# Patient Record
Sex: Female | Born: 1985 | Race: Black or African American | Hispanic: No | Marital: Single | Smoking: Never smoker
Health system: Southern US, Community
[De-identification: ages and names within clinical notes are randomized; demographics above are authoritative.]

## PROBLEM LIST (undated history)

## (undated) DIAGNOSIS — F32A Depression, unspecified: Secondary | ICD-10-CM

## (undated) DIAGNOSIS — F419 Anxiety disorder, unspecified: Secondary | ICD-10-CM

## (undated) DIAGNOSIS — I10 Essential (primary) hypertension: Secondary | ICD-10-CM

---
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
## 2008-02-20 ENCOUNTER — Emergency Department (HOSPITAL_COMMUNITY): Admission: EM | Admit: 2008-02-20 | Discharge: 2008-02-20 | Payer: Self-pay | Admitting: Emergency Medicine

## 2008-06-01 ENCOUNTER — Emergency Department (HOSPITAL_COMMUNITY): Admission: EM | Admit: 2008-06-01 | Discharge: 2008-06-01 | Payer: Self-pay | Admitting: Emergency Medicine

## 2010-09-30 NOTE — Discharge Summary (Signed)
NAMECORTNI, [name] NO.:  [id number]   MEDICAL RECORD NO.:  [id number]          PATIENT TYPE:  IPS   LOCATION:  0404                          FACILITY:  [facility]   PHYSICIAN:  Jasmine Pang, M.D. DATE OF BIRTH:  28-Dec-1985   DATE OF ADMISSION:  06/27/2006  DATE OF DISCHARGE:  06/29/2006                               DISCHARGE SUMMARY   PATIENT IDENTIFICATION:  A 25 year old, single, African-American female  who was admitted on a voluntary basis on June 27, 2006.   HISTORY OF PRESENT ILLNESS:  The patient got into an argument with a  friend who had picked on her about no job and no money. 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 The patient was scolded by her  mother. She subsequently swallowed a number of pills (Ambien). She had a  prescription for this at [facility]. This is the  first inpatient psychiatric admission for patient. She has had no other  attempts, no prior care, no history of learning disabilities. The  patient dropped out of [employer] after one semester with adjustment issues.  The patient is a nonsmoker. She does not use drugs or alcohol. She has  no medical problems, no medications. She gets a rash to PENICILLIN.   PHYSICAL FINDINGS:  The patient's physical examination was done in the  ED prior to admission. There were no acute medical problems. She had  been stabilized after her overdose.   ADMISSION LABORATORIES:  Chemistry profile was normal, liver profile  normal, CBC normal, TSH was 1.021 which was within normal limits.   HOSPITAL COURSE:  Upon admission, the patient was not started on any  medications at this time. She did not want to be on medication. Upon  first meeting her, the patient stated she had only taken 4-5 pills of  the Ambien. She was in therapy but cannot remember her therapist's name.  The patient was lying in bed when I met with her. She was friendly and  cooperative. She states she was sad  but wants to go home. She states  she only went to therapy one time. On June 29, 2006, the patient's  mental status had improved markedly. She was friendly, cooperative,  conversant, she had good eye contact, speech was normal rate and flow.  Psychomotor activity was within normal limits. The mood was euthymic,  affect wide range. No suicidal or homicidal ideation, no thoughts about  self-injurious behavior. No auditory or visual hallucinations, no  paranoia or delusions. Thoughts were logical and goal directed. Thought  content no predominant theme. Cognitive was grossly back to baseline  which was within normal limits.   DISCHARGE DIAGNOSES:  AXIS I:  Adjustment disorder not otherwise  specified. (Rule out underlying mood disorder.)  Axis II.  None.  AXIS III:  Status post overdose on Ambien, medically stable.  AXIS IV:  Severe (stage of life and adjustment issues, problems with  primary support group, occupational problem, education problem).  AXIS V:  GAF upon discharge was 50, GAF upon admission 35-40, GAF  highest past year  was 70.   POST HOSPITAL CARE PLANS:  There were no specific activity level or  dietary restrictions. The patient was scheduled for shortterm therapy  with a therapist at the [facility]. This was being arranged by her  case Production designer, theatre/television/film.   DISCHARGE MEDICATIONS:  None.      Jasmine Pang, M.D.  Electronically Signed     BHS/MEDQ  D:  08/17/2006  T:  08/17/2006  Job:  [id number]

## 2015-09-07 ENCOUNTER — Emergency Department: Payer: Enrolled Prime—HMO

## 2015-09-07 ENCOUNTER — Emergency Department
Admission: EM | Admit: 2015-09-07 | Discharge: 2015-09-07 | Disposition: A | Discharge status: Home / Self Care | Payer: Enrolled Prime—HMO | Attending: Emergency Medicine | Admitting: Emergency Medicine

## 2015-09-07 DIAGNOSIS — S51812A Laceration without foreign body of left forearm, initial encounter: Secondary | ICD-10-CM | POA: Insufficient documentation

## 2015-09-07 DIAGNOSIS — X781XXA Intentional self-harm by knife, initial encounter: Secondary | ICD-10-CM | POA: Insufficient documentation

## 2015-09-07 DIAGNOSIS — S41112A Laceration without foreign body of left upper arm, initial encounter: Secondary | ICD-10-CM

## 2015-09-07 LAB — COMPREHENSIVE METABOLIC PANEL
ALT: 10 U/L (ref 0–55)
AST (SGOT): 15 U/L (ref 5–34)
Albumin/Globulin Ratio: 1.3 (ref 0.9–2.2)
Albumin: 4.1 g/dL (ref 3.5–5.0)
Alkaline Phosphatase: 40 U/L (ref 37–106)
BUN: 11 mg/dL (ref 7.0–19.0)
Bilirubin, Total: 0.3 mg/dL (ref 0.2–1.2)
CO2: 18 mEq/L — ABNORMAL LOW (ref 22–29)
Calcium: 9.2 mg/dL (ref 8.5–10.5)
Chloride: 109 mEq/L (ref 100–111)
Creatinine: 0.9 mg/dL (ref 0.6–1.0)
Globulin: 3.1 g/dL (ref 2.0–3.6)
Glucose: 98 mg/dL (ref 70–100)
Potassium: 4.1 mEq/L (ref 3.5–5.1)
Protein, Total: 7.2 g/dL (ref 6.0–8.3)
Sodium: 139 mEq/L (ref 136–145)

## 2015-09-07 LAB — GFR: EGFR: >60

## 2015-09-07 LAB — ECG 12-LEAD
Atrial Rate: 105 {beats}/min
P Axis: 43 degrees
P-R Interval: 176 ms
Q-T Interval: 344 ms
QRS Duration: 70 ms
QTC Calculation (Bezet): 454 ms
R Axis: 16 degrees
T Axis: 12 degrees
Ventricular Rate: 105 {beats}/min

## 2015-09-07 LAB — RAPID DRUG SCREEN, URINE
Barbiturate Screen, UR: NEGATIVE
Benzodiazepine Screen, UR: NEGATIVE
Cannabinoid Screen, UR: NEGATIVE
Cocaine, UR: NEGATIVE
Opiate Screen, UR: NEGATIVE
PCP Screen, UR: NEGATIVE
Urine Amphetamine Screen: NEGATIVE

## 2015-09-07 LAB — URINALYSIS, REFLEX TO MICROSCOPIC EXAM IF INDICATED
Bilirubin, UA: NEGATIVE
Blood, UA: NEGATIVE
Glucose, UA: NEGATIVE
Ketones UA: NEGATIVE
Nitrite, UA: NEGATIVE
Protein, UR: 100 — AB
Specific Gravity UA: 1.009 (ref 1.001–1.035)
Urine pH: 7 (ref 5.0–8.0)
Urobilinogen, UA: NORMAL mg/dL

## 2015-09-07 LAB — POCT PREGNANCY TEST, URINE HCG: POCT Pregnancy HCG Test, UR: NEGATIVE

## 2015-09-07 LAB — CBC
Hematocrit: 41 % (ref 37.0–47.0)
Hgb: 13.9 g/dL (ref 12.0–16.0)
MCH: 32.7 pg — ABNORMAL HIGH (ref 28.0–32.0)
MCHC: 33.9 g/dL (ref 32.0–36.0)
MCV: 96.5 fL (ref 80.0–100.0)
MPV: 12.9 fL — ABNORMAL HIGH (ref 9.4–12.3)
Nucleated RBC: 0 /100 WBC (ref 0–1)
Platelets: 149 10*3/uL (ref 140–400)
RBC: 4.25 10*6/uL (ref 4.20–5.40)
RDW: 12 % (ref 12–15)
WBC: 5.2 10*3/uL (ref 3.50–10.80)

## 2015-09-07 LAB — ETHANOL: Alcohol: 106 mg/dL — ABNORMAL HIGH

## 2015-09-07 LAB — MAGNESIUM: Magnesium: 2.2 mg/dL (ref 1.6–2.6)

## 2015-09-07 LAB — TSH: TSH: 1.78 u[IU]/mL (ref 0.35–4.94)

## 2015-09-07 LAB — PHOSPHORUS: Phosphorus: 3.4 mg/dL (ref 2.3–4.7)

## 2015-09-07 LAB — ACETAMINOPHEN LEVEL: Acetaminophen Level: <7 ug/mL — ABNORMAL LOW (ref 10–30)

## 2015-09-07 LAB — SALICYLATE LEVEL: Salicylate Level: <5 mg/dL — ABNORMAL LOW (ref 15.0–30.0)

## 2015-09-07 MED ORDER — ONDANSETRON HCL 4 MG/2ML IJ SOLN
4.0000 mg | Freq: Once | INTRAMUSCULAR | Status: AC
Start: 2015-09-07 — End: 2015-09-07
  Administered 2015-09-07: 4 mg via INTRAVENOUS
  Filled 2015-09-07: qty 2

## 2015-09-07 MED ORDER — ONDANSETRON 4 MG PO TBDP
4.0000 mg | ORAL_TABLET | Freq: Once | ORAL | Status: AC
Start: 2015-09-07 — End: 2015-09-07
  Administered 2015-09-07: 4 mg via ORAL
  Filled 2015-09-07: qty 1

## 2015-09-07 NOTE — ED Notes (Signed)
Resident suturing LFA laceration.

## 2015-09-07 NOTE — ED Notes (Signed)
C/o nausea, Dr. Imogene Burn aware.

## 2015-09-07 NOTE — ED Provider Notes (Signed)
Physician/Midlevel provider first contact with patient: 09/07/15 0050                          [facility] EMERGENCY DEPARTMENT RESIDENT H&P       CLINICAL INFORMATION        HPI:      Chief Complaint: Suicide Attempt  .    Nichole Jenkins is a 30 y.o. female, right handed, hx depression and anxiety, who presents with self inflicted laceration to anterior L forearm.  Patient had a verbal altercation with her live-in girlfriend tonight after girlfriend told her that she was leaving.  Patient cut her arm vertically with a kitchen knife.  After police were called patient ran from police and was caught and brought in for evaluation.  [facility] was made aware for evaluation by police.  On arrival patient is calm and has a dressing on her left arm.  She states that she was on wellbutrin and trileptal for her mood but stopped 1 month ago without consultation of her psychiatrist, had been feeling depressed and restarted yesterday.  She denies suicidal ideation currently.  She is a Charity fundraiser in [employer] and her NCO Precious Reel) is present with her, states that she has made odd comments in recent days that, per his feeling, retrospectively may have been suicidal.  Patient has a psychiatrist Dr. Judithann Graves and a Camera operator.  Last tetanus shot 2016.    History obtained from: patient       Nursing (triage) note reviewed for the following pertinent information:  Pt BIBA for self-inflicted laceration to L arm. Lac spans from [facility] to wrist, bleeding controlled. Friend called 911, pt attempted to run from police. Police are at patient's bedside. +ETOH, denies drug use. VSS, HR tachy 110-120s. No medical hx. Allergies to PCN and benzos.      ROS:      Review of Systems   Constitutional: Negative for fever and chills.   Respiratory: Negative for cough and shortness of breath.    Cardiovascular: Negative for chest pain and palpitations.   Gastrointestinal: Negative for nausea, vomiting, abdominal pain, diarrhea  and constipation.   Genitourinary: Negative for dysuria, urgency and frequency.   Neurological: Negative for dizziness, weakness, light-headedness and headaches.   Psychiatric/Behavioral: Positive for self-injury and dysphoric mood. Negative for suicidal ideas.         Physical Exam:      Pulse (!) 114  BP 118/83 mmHg  Resp 16  SpO2 100 %  Temp 98.1 F (36.7 C)    Physical Exam   Constitutional: She is oriented to person, place, and time. She appears well-developed and well-nourished. No distress.   HENT:   Head: Normocephalic and atraumatic.   Mouth/Throat: Oropharynx is clear and moist.   Eyes: Conjunctivae are normal. No scleral icterus.   Neck: Normal range of motion. Neck supple. No JVD present. No tracheal deviation present.   Cardiovascular: Normal rate, regular rhythm, normal heart sounds and intact distal pulses.  Exam reveals no gallop and no friction rub.    No murmur heard.  Pulmonary/Chest: Effort normal and breath sounds normal. She has no wheezes. She has no rales.   Abdominal: Soft. Bowel sounds are normal. She exhibits no distension. There is no tenderness.   Neurological: She is alert and oriented to person, place, and time.   Skin: Skin is warm and dry. She is not diaphoretic.        Psychiatric:  She has a normal mood and affect. Her behavior is normal.   Patient's mood and affect are normal, she is speaking in clear sentences, she denies suicidal intent, no HI or auditory or visual hallucinations               PAST HISTORY        Primary Care Provider: Christa See, MD        PMH/PSH:    .     No past medical history on file.    She has no past surgical history on file.      Social/Family History:      She reports that she has never smoked. She does not have any smokeless tobacco history on file. She reports that she drinks alcohol. She reports that she does not use illicit drugs.    No family history on file.      Listed Medications on Arrival:    .     Home Medications     Last  Medication Reconciliation Action:  Complete Junious Silk, RN 09/07/2015 12:53 AM          No Medications         Allergies: She is allergic to benzodiazepines and penicillins.            VISIT INFORMATION        Reassessments/Clinical Course:      Patient's laceration repaired, psych clearance labs ordered and patient evaluated by telepsych with [facility]ifield Lysle Dingwall) and cleared for discharge with her NCO, who will drive her directly to [facility] where she will be [facility] to [facility].  He has spoken with [facility] for admission.  She is stable for discharge in the custody of her Solicitor.  Follow-up and return precautions discussed.    Results     Procedure Component Value Units Date/Time    TSH [[id number]] Collected:  09/07/15 0203    Specimen Information:  Blood Updated:  09/07/15 0305     Thyroid Stimulating Hormone 1.78 uIU/mL     UA, Reflex to Microscopic (All hospital ED's and [facility], pts 3+ yrs) [[id number]]  (Abnormal) Collected:  09/07/15 0209    Specimen Information:  Urine Updated:  09/07/15 0248     Urine Type Clean Catch      Color, UA Straw      Clarity, UA Cloudy (A)      Specific Gravity UA 1.009      Urine pH 7.0      Leukocyte Esterase, UA Trace (A)      Nitrite, UA Negative      Protein, UR 100 (A)      Glucose, UA Negative      Ketones UA Negative      Urobilinogen, UA Normal mg/dL      Bilirubin, UA Negative      Blood, UA Negative      RBC, UA 0 - 5 /hpf      WBC, UA 0 - 5 /hpf      Squamous Epithelial Cells, Urine 0 - 5 /hpf     Comprehensive metabolic panel (CMP) [[id number]]  (Abnormal) Collected:  09/07/15 0203    Specimen Information:  Blood Updated:  09/07/15 0245     Glucose 98 mg/dL      BUN 08.6 mg/dL      Creatinine 0.9 mg/dL      Sodium 578 mEq/L      Potassium 4.1 mEq/L  Chloride 109 mEq/L      CO2 18 (L) mEq/L      Calcium 9.2 mg/dL      Protein, Total 7.2 g/dL      Albumin 4.1 g/dL      AST (SGOT) 15 U/L      ALT 10 U/L       Alkaline Phosphatase 40 U/L      Bilirubin, Total 0.3 mg/dL      Globulin 3.1 g/dL      Albumin/Globulin Ratio 1.3     Magnesium [[id number]] Collected:  09/07/15 0203    Specimen Information:  Blood Updated:  09/07/15 0245     Magnesium 2.2 mg/dL     Phosphorus [[id number]] Collected:  09/07/15 0203    Specimen Information:  Blood Updated:  09/07/15 0245     Phosphorus 3.4 mg/dL     Alcohol (Ethanol)  Level [[id number]]  (Abnormal) Collected:  09/07/15 0203    Specimen Information:  Blood Updated:  09/07/15 0245     Alcohol 106 (H) mg/dL     Acetaminophen level [[id number]]  (Abnormal) Collected:  09/07/15 0203    Specimen Information:  Blood Updated:  09/07/15 0245     Acetaminophen Level <7 (L) ug/mL     ASA  level [[id number]]  (Abnormal) Collected:  09/07/15 0203    Specimen Information:  Blood Updated:  09/07/15 0245     Salicylate Level <5.0 (L) mg/dL     GFR [[id number]] Collected:  09/07/15 0203     EGFR >60.0 Updated:  09/07/15 0245    Urine Rapid Drug Screen [[id number]] Collected:  09/07/15 0203    Specimen Information:  Urine Updated:  09/07/15 0240     Amphetamine Screen, UR Negative      Barbiturate Screen, UR Negative      Benzodiazepine Screen, UR Negative      Cannabinoid Screen, UR Negative      Cocaine, UR Negative      Opiate Screen, UR Negative      PCP Screen, UR Negative     CBC without differential [[id number]]  (Abnormal) Collected:  09/07/15 0203    Specimen Information:  Blood from Blood Updated:  09/07/15 0224     WBC 5.20 x10 3/uL      Hgb 13.9 g/dL      Hematocrit 10.6 %      Platelets 149 x10 3/uL      RBC 4.25 x10 6/uL      MCV 96.5 fL      MCH 32.7 (H) pg      MCHC 33.9 g/dL      RDW 12 %      MPV 12.9 (H) fL      Nucleated RBC 0 /100 WBC     Urine HCG POC ([facility] Only) [[id number]] Collected:  09/07/15 0203     POCT QC Pass Updated:  09/07/15 0208     POCT Pregnancy HCG Test, UR Negative      Comment:        Result:        Negative Value is Normal in Healthy Males or Healthy non-pregnant  Females            EKG: rate 105, sinus tachy, nl intervals, no ST-T changes      Conversations with Other Providers:              Medications Given in the ED:    .     ED Medication Orders  None            Procedures:      Lac Repair  Date/Time: 09/07/2015 3:38 AM  Performed by: Sallee Lange Ulysse Siemen J  Authorized by: Gracelyn Nurse    Consent:     Consent obtained:  Verbal    Consent given by:  Patient    Risks discussed:  Pain, infection, poor cosmetic result, need for additional repair and tendon damage    Alternatives discussed:  Delayed treatment  Anesthesia (see MAR for exact dosages):     Anesthesia method:  Local infiltration    Local anesthetic:  Lidocaine 1% w/o epi (9cc)  Laceration details:     Location:  Shoulder/arm    Shoulder/arm location:  L lower arm    Length (cm):  21    Depth (mm):  0.5  Repair type:     Repair type:  Intermediate  Pre-procedure details:     Preparation:  Patient was prepped and draped in usual sterile fashion  Exploration:     Hemostasis achieved with:  Direct pressure    Wound exploration: wound explored through full range of motion and entire depth of wound probed and visualized      Wound extent: areolar tissue violated      Contaminated: no    Treatment:     Area cleansed with:  Saline    Amount of cleaning:  Standard    Irrigation solution:  Sterile water    Irrigation volume:  900    Irrigation method:  Pressure wash    Visualized foreign bodies/material removed: no    Subcutaneous repair:     Suture size:  4-0    Suture material:  Vicryl    Number of sutures:  1  Skin repair:     Repair method:  Sutures    Suture size:  4-0    Suture material:  Nylon    Suture technique:  Simple interrupted    Number of sutures:  26  Approximation:     Approximation:  Close    Vermilion border: well-aligned    Post-procedure details:     Dressing:  Antibiotic ointment and sterile dressing    Patient tolerance of procedure:  Tolerated well, no immediate complications        Assessment/Plan:                    Esmond Camper, DO  Resident  09/07/15 0402    Esmond Camper, DO  Resident  09/07/15 [phone]

## 2015-09-07 NOTE — ED Provider Notes (Signed)
[facility] EMERGENCY DEPARTMENT H&P                                             ATTENDING SUPERVISORY NOTE             CLINICAL SUMMARY          Diagnosis:    .     Final diagnoses:   Arm laceration, left, initial encounter         MDM Notes:      Arm laceration repaired in the ED. Patient medically clear for psych admission at [facility]. No tendon injury on exam but advised f/u with hand. Pt has no other acute findings. Stable vitals. +etoh but no evidence of wtihdrawal. NV intact. No evidence of neurovascular injury on exam. Bleeding controlled on laceration.    No other acute complaints.     Based on the patient's clinical presentation, vital signs, and diagnostic studies, I feel the patient is safe for discharge. The work up has shown no signs of life-threatening emergency. The patient understands that follow up is needed, and if unable to do this - the patient should return to the ER for a repeat evaluation.    The patient/family understands their instructions and the patient is well-appearing at time of discharge. I explained results and discharge instructions to the patient/family. The patient/family acknowledges understanding and agrees with plan.            Disposition:      ED Disposition     Discharge Nichole Jenkins discharge to home/self care.    Condition at disposition: Stable                      ATTENDING NOTE      The patient was seen and examined by the mid-level (physician's assistant or nurse practitioner), resident or fellow, and the plan of care was discussed with me. I agree with the plan as it was presented to me.  I have reviewed and agree with the final ED diagnosis.    I spoke to and examined the patient as well: Yes  I was present during key portions of any procedures performed: Yes    Nichole Jenkins is a 30 y.o. female with pmhx of anxiety and depression BIBA as an ECO presents with self-inflicted laceration to left arm. She had fight with girlfriend  tonight. She took a Interior and spatial designer and cut her left arm from wrist to elbow. Friend called 911 and pt attempted to run from police.  She reports not taking depression medications for 1 week.      REVIEW OF SYSTEMS: Positive and negative ROS elements as per HPI.  All other systems reviewed and negative.    Constitutional: Vital signs reviewed. Well appearing.  Head: Normocephalic, atraumatic  Eyes: No conjunctival injection. No discharge. PERRL, EOMI  ENT: Mucous membranes moist  Neck: Normal range of motion. Trachea midline.  Respiratory/Chest: clear to auscultation. No wheezes, rales or rhonchi. Good air movement.  No dyspnea or work of breathing.  Cardiovascular: Regular rate and rhythm. No murmur.   Abdomen: soft and nontender in all quadrants. No guarding or rebound. No masses or hepatosplenomegaly.Nichole Jenkins  Upper Extremities:No edema. No cyanosis. ~21 cm  linear laceration to left forearm. full strength in all tendons in left hand, wrist and digit in flexion/extension. FROM of all joints.  Lower Extremities: No edema. No cyanosis.  Neurological: Alert Normal and symmetric motor tone by observation. Speech normal. Memory Normal. No facial assymetry.  Skin: Warm and dry. No rash.  Psychiatric: Normal affect. Normal concentration.                  VISIT INFORMATION        Clinical Course in the ED:            Medications Given in the ED:    .     ED Medication Orders     Start Ordered     Status Ordering Provider    09/07/15 [phone] 09/07/15 0418  ondansetron (ZOFRAN) injection 4 mg   Once     Route: Intravenous  Ordered Dose: 4 mg     Last MAR action:  Given RUTENBERG, ADAM J    09/07/15 0401 09/07/15 0400  ondansetron (ZOFRAN-ODT) disintegrating tablet 4 mg   Once     Route: Oral  Ordered Dose: 4 mg     Last MAR action:  Given Jocelyn Nold            Procedures:      Procedures      Interpretations:      Differential Diagnosis considered by MD (not completely inclusive): psychosis, acute schizophrenia, dementia,  delusions, delerium, laceration, abrasion, puncture, tendon injury, cellulitis      EKG was Reviewed, Analyzed and Interpreted by me: Yes: sinus tach 105. Normal axis. No ST elevation. No TWI.      Rhythm Strip Interpretation / Cardiac Monitor Analysis: N/A    Pulse Ox Analysis: Yes: saturation: 100 %; Oxygen use: room air; Interpretation: Normal      Critical Care Time: No Critical Care Time          Laboratory results reviewed and interpreted by me: Yes    Radiologic study results reviewed by me: N/A    Radiologic Studies Interpreted (viewed) by me: N/A      All tests, tracings, EKGs, vital signs and radiological studies above where applicable were interpreted by me, Gracelyn Nurse MD.            PAST HISTORY        Primary Care Provider: Christa See, MD        PMH/PSH:    .     No past medical history on file.    She has no past surgical history on file.      Social/Family History:      She reports that she has never smoked. She does not have any smokeless tobacco history on file. She reports that she drinks alcohol. She reports that she does not use illicit drugs.    No family history on file.      Listed Medications on Arrival:    .     There are no discharge medications for this patient.     Allergies: She is allergic to benzodiazepines and penicillins.            RESULTS        Lab Results:      Results     Procedure Component Value Units Date/Time    TSH [[id number]] Collected:  09/07/15 0203    Specimen Information:  Blood Updated:  09/07/15 0305     Thyroid Stimulating Hormone 1.78 uIU/mL     UA, Reflex to Microscopic (All hospital ED's and [facility], pts 3+ yrs) [[id number]]  (Abnormal) Collected:  09/07/15 0209  Specimen Information:  Urine Updated:  09/07/15 0248     Urine Type Clean Catch      Color, UA Straw      Clarity, UA Cloudy (A)      Specific Gravity UA 1.009      Urine pH 7.0      Leukocyte Esterase, UA Trace (A)      Nitrite, UA Negative      Protein, UR 100 (A)      Glucose,  UA Negative      Ketones UA Negative      Urobilinogen, UA Normal mg/dL      Bilirubin, UA Negative      Blood, UA Negative      RBC, UA 0 - 5 /hpf      WBC, UA 0 - 5 /hpf      Squamous Epithelial Cells, Urine 0 - 5 /hpf     Comprehensive metabolic panel (CMP) [[id number]]  (Abnormal) Collected:  09/07/15 0203    Specimen Information:  Blood Updated:  09/07/15 0245     Glucose 98 mg/dL      BUN 40.9 mg/dL      Creatinine 0.9 mg/dL      Sodium 811 mEq/L      Potassium 4.1 mEq/L      Chloride 109 mEq/L      CO2 18 (L) mEq/L      Calcium 9.2 mg/dL      Protein, Total 7.2 g/dL      Albumin 4.1 g/dL      AST (SGOT) 15 U/L      ALT 10 U/L      Alkaline Phosphatase 40 U/L      Bilirubin, Total 0.3 mg/dL      Globulin 3.1 g/dL      Albumin/Globulin Ratio 1.3     Magnesium [[id number]] Collected:  09/07/15 0203    Specimen Information:  Blood Updated:  09/07/15 0245     Magnesium 2.2 mg/dL     Phosphorus [[id number]] Collected:  09/07/15 0203    Specimen Information:  Blood Updated:  09/07/15 0245     Phosphorus 3.4 mg/dL     Alcohol (Ethanol)  Level [[id number]]  (Abnormal) Collected:  09/07/15 0203    Specimen Information:  Blood Updated:  09/07/15 0245     Alcohol 106 (H) mg/dL     Acetaminophen level [[id number]]  (Abnormal) Collected:  09/07/15 0203    Specimen Information:  Blood Updated:  09/07/15 0245     Acetaminophen Level <7 (L) ug/mL     ASA  level [[id number]]  (Abnormal) Collected:  09/07/15 0203    Specimen Information:  Blood Updated:  09/07/15 0245     Salicylate Level <5.0 (L) mg/dL     GFR [[id number]] Collected:  09/07/15 0203     EGFR >60.0 Updated:  09/07/15 0245    Urine Rapid Drug Screen [[id number]] Collected:  09/07/15 0203    Specimen Information:  Urine Updated:  09/07/15 0240     Amphetamine Screen, UR Negative      Barbiturate Screen, UR Negative      Benzodiazepine Screen, UR Negative      Cannabinoid Screen, UR Negative      Cocaine, UR Negative      Opiate Screen, UR Negative      PCP Screen, UR  Negative     CBC without differential [[id number]]  (Abnormal) Collected:  09/07/15 0203    Specimen Information:  Blood from Blood Updated:  09/07/15  0224     WBC 5.20 x10 3/uL      Hgb 13.9 g/dL      Hematocrit 16.1 %      Platelets 149 x10 3/uL      RBC 4.25 x10 6/uL      MCV 96.5 fL      MCH 32.7 (H) pg      MCHC 33.9 g/dL      RDW 12 %      MPV 12.9 (H) fL      Nucleated RBC 0 /100 WBC     Urine HCG POC ([facility] Only) [[id number]] Collected:  09/07/15 0203     POCT QC Pass Updated:  09/07/15 0208     POCT Pregnancy HCG Test, UR Negative      Comment:        Result:        Negative Value is Normal in Healthy Males or Healthy non-pregnant Females              Radiology Results:      No orders to display               Scribe Attestation:      I was acting as a Neurosurgeon for Gracelyn Nurse, MD on Servando Snare, [place]   I am the first provider for this patient and I personally performed the services documented. Alonza Bogus, [place] is scribing for me on [place]. This note and the patient instructions accurately reflect work and decisions made by me.  Gracelyn Nurse, MD                               Gracelyn Nurse, MD  09/07/15 [phone]

## 2015-09-07 NOTE — Discharge Instructions (Signed)
Dear Ms. Langsam:    Thank you for choosing the [facility] Emergency Department, the premier emergency department in the [place] area.  I hope your visit today was EXCELLENT.    Specific instructions for your visit today:    You were seen after you cut your arm.  You had a medical evaluation and spoke with telepsych here from [employer].  Your arm was repaired.  You stated that you had a recent tetanus shot so we did not need to give you another.  You have 26 stitches in your arm that will need to come out in 7-10 days.  This may be done here or at your military clinic.  You also have one suture underneath your skin that does not need to come out since it will dissolve on its own.  You may be discharged with your NCO to go directly to [facility] to be admitted there.  Return to this or the closest ER if you have thoughts of hurting yourself, feel unsafe, have any redness, pus, or spreading rash from your wound, fevers over 100.4, or any new or worsening symptoms.    If you do not continue to improve or your condition worsens, please contact your doctor or return immediately to the Emergency Department.    Sincerely,  Gracelyn Nurse, MD  Attending Emergency Physician  [facility] Emergency Department    ONSITE PHARMACY  Our full service onsite pharmacy is located in the ER waiting room.  Open 7 days a week from 9 am to 11 pm.  We accept all major insurances and prices are competitive with major retailers.  Ask your provider to print your prescriptions down to the pharmacy to speed you on your way home.    OBTAINING A PRIMARY CARE APPOINTMENT    Primary care physicians (PCPs, also known as primary care doctors) are either internists or family medicine doctors. Both types of PCPs focus on health promotion, disease prevention, patient education and counseling, and treatment of acute and chronic medical conditions.    Call for an appointment with a primary care doctor.  Ask to see who is taking  new patients.     [facility]  telephone:  [phone]  [email or web address]    DOCTOR REFERRALS  Call [phone] (available 24 hours a day, 7 days a week) if you need any further referrals and we can help you find a primary care doctor or specialist.  Also, available online at:  [email or web address]    YOUR CONTACT INFORMATION  Before leaving please check with registration to make sure we have an up-to-date contact number.  You can call registration at [phone] to update your information.  For questions about your hospital bill, please call [phone].  For questions about your Emergency Dept Physician bill please call [phone].      FREE HEALTH SERVICES  If you need help with health or social services, please call 2-1-1 for a free referral to resources in your area.  2-1-1 is a free service connecting people with information on health insurance, free clinics, pregnancy, mental health, dental care, food assistance, housing, and substance abuse counseling.  Also, available online at:  http://www.[place].org    MEDICAL RECORDS AND TESTS  Certain laboratory test results do not come back the same day, for example urine cultures.   We will contact you if other important findings are noted.  Radiology films are often reviewed again to ensure accuracy.  If there is any discrepancy, we will  notify you.      Please call [phone] to pick up a complimentary CD of any radiology studies performed.  If you or your doctor would like to request a copy of your medical records, please call [phone].      ORTHOPEDIC INJURY   Please know that significant injuries can exist even when an initial x-ray is read as normal or negative.  This can occur because some fractures (broken bones) are not initially visible on x-rays.  For this reason, close outpatient follow-up with your primary care doctor or bone specialist (orthopedist) is required.    MEDICATIONS AND FOLLOWUP  Please be  aware that some prescription medications can cause drowsiness.  Use caution when driving or operating machinery.    The examination and treatment you have received in our Emergency Department is provided on an emergency basis, and is not intended to be a substitute for your primary care physician.  It is important that your doctor checks you again and that you report any new or remaining problems at that time.      [facility]  The nearest 24 hour pharmacy is:    [facility] [address], [place] [address]  Joanna Act  [facility])  Call to start or finish an application, compare plans, enroll or ask a question.  [place]: [phone]  Web:  Healthcare.gov    Help Enrolling in [place]  ([phone] (TOLL-FREE)  [phone] (TTY)  Web:  Http://www.coverva.org    Local Help Enrolling in the [place]  920-871-8805 (MAIN)  Email:  health-help@nvfs .org  Web:  http://lewis-perez.info/  Address:  [address], [address] [place], [place] [address]    SEDATING MEDICATIONS  Sedating medications include strong pain medications (e.g. narcotics), muscle relaxers, benzodiazepines (used for anxiety and as muscle relaxers), Benadryl/diphenhydramine and other antihistamines for allergic reactions/itching, and other medications.  If you are unsure if you have received a sedating medication, please ask your physician or nurse.  If you received a sedating medication: DO NOT drive a car. DO NOT operate machinery. DO NOT perform jobs where you need to be alert.  DO NOT drink alcoholic beverages while taking this medicine.     If you get dizzy, sit or lie down at the first signs. Be careful going up and down stairs.  Be extra careful to prevent falls.     Never give this medicine to others.     Keep this medicine out of reach of children.     Do not take or save old medicines. Throw them  away when outdated.     Keep all medicines in a cool, dry place. DO NOT keep them in your bathroom medicine cabinet or in a cabinet above the stove.    MEDICATION REFILLS  Please be aware that we cannot refill any prescriptions through the ER. If you need further treatment from what is provided at your ER visit, please follow up with your primary care doctor or your pain management specialist.    [place]  Did you know [facility] has two freestanding ERs located just a few miles away?  [facility] [place] have short wait times, easy free parking directly in front of the building and top patient satisfaction scores - and the  same Board Certified Emergency Medicine doctors as [facility].              Wyatt Galvan  [id number]  [phone]  [phone]  09/07/2015    Discharge Instructions    As always, you are the most important factor in your recovery.  Please follow these instructions carefully.  If you have problems that we have not discussed, CALL OR VISIT YOUR DOCTOR RIGHT AWAY.     If you can't reach your doctor, return to the emergency department.    I Joye Rothert understand the written and discussed instructions.  My questions have been answered.  I acknowledge receipt of these instructions.     Patient or responsible person:         Patient's Signature               Physician or Nurse              Laceration, General Wound Care    Use the following wound care instructions for your laceration (cut):   Keep the wound clean and dry for the next 24 hours. Avoid excessive moisture. You can wash the wound gently with soap and water. Then apply a dry bandage.   DO NOT allow your wound to soak in water (don't do the dishes or go swimming, for example). You can shower, but do not rub your stitches too hard. Let the wound dry before putting another bandage on.   Take off old dressings every day. Then put on a clean, dry  dressing.   If the dressing sticks to the wound, slightly moisten it with water. This way, it can come off more easily.   To help remove a scab, cleanse the area with a mixture of half hydrogen peroxide and half water. This will also help [place] to take out the sutures when they are ready to be taken out.   Let the area dry thoroughly.   Unless you receive instructions not to do so, you can place a thin layer of antibiotic ointment over the wound. You can buy Polysporin, Bacitracin, or Neosporin at the store. Neosporin can sometimes cause irritation to your skin. If this happens, stop using it and switch to another topical (surface) antibiotic.   If needed, put a clean, dry bandage over the wound to protect it.    Keep the injured area elevated (lifted) for the next 24 hours. This will decrease swelling and pain. You may also want to put ice on the area. Place some ice cubes in a re-sealable (Ziploc) bag and add some water. Put a thin washcloth between the bag and the skin. Apply the ice bag to the area for at least 20 minutes. Do this at least 4 times per day. It is okay to do this more often than directed. You can also do it for longer than directed. NEVER APPLY ICE DIRECTLY TO THE SKIN.    If you had a local anesthetic, it will wear off in about 2 hours. Until then, be careful not to hurt yourself because of having less feeling in the area.    Not all lacerations (cuts) will need antibiotics. Your doctor may have decided that you need antibiotics to prevent an infection. Be sure to fill the prescription and take all medicines as directed.    If your doctor gave you a prescription for pain medicine, fill the prescription and use the medicine as directed.    YOU SHOULD SEEK MEDICAL ATTENTION IMMEDIATELY,  EITHER HERE OR AT THE NEAREST EMERGENCY DEPARTMENT, IF ANY OF THE FOLLOWING OCCURS:   You see redness or swelling.   There are red streaks or there is redness around the wound.   The wound smells bad  or has a lot of drainage.   You have fever (temperature higher than 100.2F or 38C), chills, worse pain and / or swelling.              Laceration, Sutures    You have been treated for a laceration (cut).    Follow up with your doctor OR come back here OR go to the nearest Emergency Department to have your sutures (stitches) taken out. Sutures should be taken out in:   10 days.    Use the following wound care instructions:   Keep the wound clean and dry for the next 24 hours. You can wash the wound gently with soap and water.   DO NOT allow your wound to soak in water (don't do the dishes or go swimming, for example). You can shower, but do not rub your stitches too hard. Let the wound dry before putting another bandage on.   Take off old dressings every day. Then put on a clean, dry dressing.   If the bandage sticks to the wound, slightly moisten it with water. This way, it can come off more easily.   You can wash the wound gently with soap and water. To help remove a scab, cleanse the area with a mixture of half hydrogen peroxide and half water. This will also help [place] to take out the sutures later.   Allow the area to dry completely before putting on a new bandage.   Unless you receive instructions not to do so, you can place a thin layer of antibiotic ointment over the wound. You can buy Polysporin, Bacitracin, or Neosporin at the store. Neosporin can sometimes cause irritation to your skin. If this happens, stop using it and switch to another topical (surface) antibiotic.   If needed, put a clean, dry bandage over the wound to protect it.    Keep the affected area elevated (lifted) for the next 24 hours. This will decrease swelling and pain. You may also want to put ice on the area. By applying ice to the affected area, swelling and pain can be reduced. Place some ice cubes in a re-sealable (Ziploc) bag and add some water. Put a thin washcloth between the bag and the skin. Apply the ice bag  to the area for at least 20 minutes. Do this at least 4 times per day. It is OK to use the ice more frequently and for longer periods of time. DO NOT APPLY ICE DIRECTLY TO THE SKIN!    If you had a local anesthetic, it will wear off in about 2 hours. Until then, be careful not to hurt yourself because of having less feeling in the area.    YOU SHOULD SEEK MEDICAL ATTENTION IMMEDIATELY, EITHER HERE OR AT THE NEAREST EMERGENCY DEPARTMENT, IF ANY OF THE FOLLOWING OCCURS:   You see redness or swelling.   There are red streaks going up from the injured area.   The wound smells bad or has a lot of drainage.   You have fever (temperature higher than 100.2F / 38C), chills, worse pain and / or swelling.

## 2015-09-07 NOTE — ED Notes (Signed)
Suturing completed, dressing intact.  Waiting for telepsych.  Sitter at bedside.

## 2015-09-07 NOTE — ED Notes (Signed)
Bed: S 16  Expected date:   Expected time:   Means of arrival:   Comments:  Medic 426

## 2015-09-07 NOTE — ED Notes (Signed)
Resident still suturing LFA laceration.

## 2015-09-07 NOTE — ED Notes (Signed)
Arna Medici from [employer] saw the patient on 09/07/15 and allowed the [employer] officer seeing her escort her to another facility for further treatment, which was accepted by a higher Corporate treasurer.

## 2015-10-05 ENCOUNTER — Emergency Department
Admission: EM | Admit: 2015-10-05 | Discharge: 2015-10-06 | Disposition: A | Discharge status: Other Acute Inpatient Hospital | Payer: Enrolled Prime—HMO | Attending: Emergency Medicine | Admitting: Emergency Medicine

## 2015-10-05 ENCOUNTER — Emergency Department: Payer: Enrolled Prime—HMO

## 2015-10-05 DIAGNOSIS — T1491 Suicide attempt: Secondary | ICD-10-CM

## 2015-10-05 DIAGNOSIS — X788XXA Intentional self-harm by other sharp object, initial encounter: Secondary | ICD-10-CM | POA: Insufficient documentation

## 2015-10-05 DIAGNOSIS — T07XXXA Unspecified multiple injuries, initial encounter: Secondary | ICD-10-CM

## 2015-10-05 DIAGNOSIS — F32A Depression, unspecified: Secondary | ICD-10-CM

## 2015-10-05 DIAGNOSIS — F329 Major depressive disorder, single episode, unspecified: Secondary | ICD-10-CM | POA: Insufficient documentation

## 2015-10-05 DIAGNOSIS — S61214A Laceration without foreign body of right ring finger without damage to nail, initial encounter: Secondary | ICD-10-CM | POA: Insufficient documentation

## 2015-10-05 DIAGNOSIS — T1491XA Suicide attempt, initial encounter: Secondary | ICD-10-CM

## 2015-10-05 DIAGNOSIS — F4310 Post-traumatic stress disorder, unspecified: Secondary | ICD-10-CM | POA: Insufficient documentation

## 2015-10-05 DIAGNOSIS — F419 Anxiety disorder, unspecified: Secondary | ICD-10-CM | POA: Insufficient documentation

## 2015-10-05 DIAGNOSIS — S51812A Laceration without foreign body of left forearm, initial encounter: Secondary | ICD-10-CM | POA: Insufficient documentation

## 2015-10-05 DIAGNOSIS — S1191XA Laceration without foreign body of unspecified part of neck, initial encounter: Secondary | ICD-10-CM | POA: Insufficient documentation

## 2015-10-05 HISTORY — DX: Anxiety disorder, unspecified: F41.9

## 2015-10-05 HISTORY — DX: Depression, unspecified: F32.A

## 2015-10-05 LAB — RAPID DRUG SCREEN, URINE
Barbiturate Screen, UR: NEGATIVE
Benzodiazepine Screen, UR: NEGATIVE
Cannabinoid Screen, UR: NEGATIVE
Cocaine, UR: NEGATIVE
Opiate Screen, UR: NEGATIVE
PCP Screen, UR: NEGATIVE
Urine Amphetamine Screen: NEGATIVE

## 2015-10-05 LAB — URINALYSIS, REFLEX TO MICROSCOPIC EXAM IF INDICATED
Bilirubin, UA: NEGATIVE
Glucose, UA: NEGATIVE
Ketones UA: NEGATIVE
Leukocyte Esterase, UA: NEGATIVE
Nitrite, UA: NEGATIVE
Protein, UR: NEGATIVE
Specific Gravity UA: 1.005 (ref 1.001–1.035)
Urine pH: 7 (ref 5.0–8.0)
Urobilinogen, UA: NORMAL mg/dL

## 2015-10-05 LAB — COMPREHENSIVE METABOLIC PANEL
ALT: 15 U/L (ref 0–55)
AST (SGOT): 19 U/L (ref 5–34)
Albumin/Globulin Ratio: 1.3 (ref 0.9–2.2)
Albumin: 4.2 g/dL (ref 3.5–5.0)
Alkaline Phosphatase: 43 U/L (ref 37–106)
BUN: 11 mg/dL (ref 7.0–19.0)
Bilirubin, Total: 0.3 mg/dL (ref 0.2–1.2)
CO2: 20 mEq/L — ABNORMAL LOW (ref 22–29)
Calcium: 9.4 mg/dL (ref 8.5–10.5)
Chloride: 110 mEq/L (ref 100–111)
Creatinine: 0.9 mg/dL (ref 0.6–1.0)
Globulin: 3.2 g/dL (ref 2.0–3.6)
Glucose: 115 mg/dL — ABNORMAL HIGH (ref 70–100)
Potassium: 3.9 mEq/L (ref 3.5–5.1)
Protein, Total: 7.4 g/dL (ref 6.0–8.3)
Sodium: 141 mEq/L (ref 136–145)

## 2015-10-05 LAB — SALICYLATE LEVEL: Salicylate Level: <5 mg/dL — ABNORMAL LOW (ref 15.0–30.0)

## 2015-10-05 LAB — POCT PREGNANCY TEST, URINE HCG: POCT Pregnancy HCG Test, UR: NEGATIVE

## 2015-10-05 LAB — CBC AND DIFFERENTIAL
Basophils Absolute Automated: 0.03 10*3/uL (ref 0.00–0.20)
Basophils Automated: 0 %
Eosinophils Absolute Automated: 0.14 10*3/uL (ref 0.00–0.70)
Eosinophils Automated: 2 %
Hematocrit: 41.8 % (ref 37.0–47.0)
Hgb: 14 g/dL (ref 12.0–16.0)
Immature Granulocytes Absolute: 0.01 10*3/uL
Immature Granulocytes: 0 %
Lymphocytes Absolute Automated: 1.46 10*3/uL (ref 0.50–4.40)
Lymphocytes Automated: 25 %
MCH: 32 pg (ref 28.0–32.0)
MCHC: 33.5 g/dL (ref 32.0–36.0)
MCV: 95.7 fL (ref 80.0–100.0)
MPV: 13.1 fL — ABNORMAL HIGH (ref 9.4–12.3)
Monocytes Absolute Automated: 0.33 10*3/uL (ref 0.00–1.20)
Monocytes: 6 %
Neutrophils Absolute: 3.81 10*3/uL (ref 1.80–8.10)
Neutrophils: 66 %
Nucleated RBC: 0 /100 WBC (ref 0–1)
Platelets: 158 10*3/uL (ref 140–400)
RBC: 4.37 10*6/uL (ref 4.20–5.40)
RDW: 12 % (ref 12–15)
WBC: 5.78 10*3/uL (ref 3.50–10.80)

## 2015-10-05 LAB — MAGNESIUM: Magnesium: 2.3 mg/dL (ref 1.6–2.6)

## 2015-10-05 LAB — ACETAMINOPHEN LEVEL: Acetaminophen Level: <7 ug/mL — ABNORMAL LOW (ref 10–30)

## 2015-10-05 LAB — TSH: TSH: 2.16 u[IU]/mL (ref 0.35–4.94)

## 2015-10-05 LAB — PHOSPHORUS: Phosphorus: 2.6 mg/dL (ref 2.3–4.7)

## 2015-10-05 LAB — ETHANOL: Alcohol: 173 mg/dL — ABNORMAL HIGH

## 2015-10-05 LAB — GFR: EGFR: >60

## 2015-10-05 MED ORDER — ACETAMINOPHEN 500 MG PO TABS
1000.0000 mg | ORAL_TABLET | Freq: Once | ORAL | Status: AC
Start: 2015-10-05 — End: 2015-10-05
  Administered 2015-10-05: 1000 mg via ORAL
  Filled 2015-10-05: qty 2

## 2015-10-05 MED ORDER — ONDANSETRON 4 MG PO TBDP
4.0000 mg | ORAL_TABLET | Freq: Once | ORAL | Status: AC
Start: 2015-10-05 — End: 2015-10-05
  Administered 2015-10-05: 4 mg via ORAL
  Filled 2015-10-05: qty 1

## 2015-10-05 MED ORDER — SODIUM CHLORIDE 0.9 % IV BOLUS
1000.0000 mL | Freq: Once | INTRAVENOUS | Status: AC
Start: 2015-10-05 — End: 2015-10-05
  Administered 2015-10-05: 1000 mL via INTRAVENOUS
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
## 2015-10-05 NOTE — ED Notes (Signed)
PTS DISPATCH CALLED W/A 2330 ETA; Rn ADVISED

## 2015-10-05 NOTE — ED Provider Notes (Signed)
[facility]  Date/Time: 10/05/2015 11:17 PM  Performed by: Almira Coaster  Authorized by: Rudi Rummage    Consent:     Consent obtained:  Verbal    Consent given by:  Patient and healthcare agent  Anesthesia (see MAR for exact dosages):     Anesthesia method:  Local infiltration    Local anesthetic:  Lidocaine 2% w/o epi  Laceration details:     Location:  Shoulder/arm    Shoulder/arm location:  L lower arm    Length (cm):  18    Depth (mm):  5  Repair type:     Repair type:  Intermediate  Pre-procedure details:     Preparation:  Patient was prepped and draped in usual sterile fashion  Exploration:     Hemostasis achieved with:  Direct pressure    Wound exploration: wound explored through full range of motion and entire depth of wound probed and visualized      Wound extent: fascia violated, muscle damage and vascular damage      Wound extent: no foreign bodies/material noted, no nerve damage noted, no tendon damage noted and no underlying fracture noted      Contaminated: no    Treatment:     Area cleansed with:  Saline    Amount of cleaning:  Extensive    Irrigation solution:  Sterile saline    Irrigation volume:  100    Irrigation method:  Pressure wash    Visualized foreign bodies/material removed: no    Fascia repair:     Suture size:  4-0    Suture material:  Vicryl    Suture technique:  Simple interrupted    Number of sutures:  5  Subcutaneous repair:     Suture size:  4-0    Suture material:  Vicryl    Suture technique:  Simple interrupted    Number of sutures:  8  Skin repair:     Repair method:  Staples  Approximation:     Approximation:  Close    Vermilion border: well-aligned    Post-procedure details:     Dressing:  Antibiotic ointment, sterile dressing and bulky dressing    Patient tolerance of procedure:  Tolerated with difficulty  Comments:      Neck wounds linear and and only minor gaping. Pt is uncooperative to suturing of wounds on neck.  steristrips was able to be placed in between pts  uncooperative fits. Good approximation was achieved and mastisol used for bondage. Left  side of neck with 3 wounds. 12 cm, 9 cm and 6 cm.   Rt side of neck with long 12 cm linear nongaping laceration. All in Anterior posterior orientation.     Rt 4th finger with flap wound 3mm deep, no fb or tendon damage, bleeding controlled with pressure.  Full ROM to PIP and DIP. (4) 4-0 nylon sutures placed with good approximation.  2% plain Xylocaine used for digital block, with good anesthetic results. Pt tolerated procedure with not problems.         Almira Coaster, [place]  10/05/15 2327    Almira Coaster, [place]  10/05/15 [phone]

## 2015-10-05 NOTE — ED Notes (Signed)
Bed: S C5  Expected date:   Expected time:   Means of arrival: [id number] - [place]  Comments:  408-->14

## 2015-10-06 LAB — ECG 12-LEAD
Atrial Rate: 119 {beats}/min
P Axis: 49 degrees
P-R Interval: 138 ms
Q-T Interval: 312 ms
QRS Duration: 66 ms
QTC Calculation (Bezet): 438 ms
R Axis: 31 degrees
T Axis: 21 degrees
Ventricular Rate: 119 {beats}/min

## 2015-10-06 MED ORDER — ONDANSETRON HCL 4 MG/2ML IJ SOLN
4.0000 mg | Freq: Once | INTRAMUSCULAR | Status: AC
Start: 2015-10-06 — End: 2015-10-06
  Administered 2015-10-06: 4 mg via INTRAVENOUS
  Filled 2015-10-06: qty 2

## 2015-10-06 NOTE — ED Notes (Signed)
RN called from [facility] to ask if patient ever mentioned "jumping out of a car". This RN told the nurse that no mention of jumping out of a car was made by police or patient during stay. Patients only complaint of pain was her 4th digit on her right hand. MD made aware and placed on the phone with Caesar Chestnut MD to discuss situation.

## 2015-10-06 NOTE — ED Notes (Addendum)
Patient admitted to her Solicitor and that took "some extra medication that she takes for nightmares". Patient reports unknown amount of that medication or time and MD Mat Carne was made aware. No new further orders and she stated patient was ok for transport. Patient having nausea and was given zofran odt. Shortly after patient vomited x 1 and was given zofran IV just before transport. Patient alert and oriented on discharge from ED

## 2015-10-06 NOTE — ED Notes (Signed)
Patient with police at beside, mobile crisis and the Solicitor who will accompany patient to [place].

## 2015-10-06 NOTE — ED Notes (Signed)
MD made aware of drop in BP, will continue to monitor

## 2017-08-22 ENCOUNTER — Encounter (HOSPITAL_COMMUNITY): Payer: Self-pay | Admitting: Emergency Medicine

## 2017-08-22 ENCOUNTER — Emergency Department (HOSPITAL_COMMUNITY)
Admission: EM | Admit: 2017-08-22 | Discharge: 2017-08-22 | Disposition: A | Discharge status: Home / Self Care | Payer: Self-pay | Attending: Emergency Medicine | Admitting: Emergency Medicine

## 2017-08-22 ENCOUNTER — Emergency Department (HOSPITAL_COMMUNITY): Payer: Self-pay

## 2017-08-22 DIAGNOSIS — B9689 Other specified bacterial agents as the cause of diseases classified elsewhere: Secondary | ICD-10-CM | POA: Insufficient documentation

## 2017-08-22 DIAGNOSIS — J208 Acute bronchitis due to other specified organisms: Secondary | ICD-10-CM | POA: Insufficient documentation

## 2017-08-22 LAB — COMPREHENSIVE METABOLIC PANEL
ALT: 20 U/L (ref 14–54)
ANION GAP: 10 (ref 5–15)
AST: 23 U/L (ref 15–41)
Albumin: 3.9 g/dL (ref 3.5–5.0)
Alkaline Phosphatase: 39 U/L (ref 38–126)
BILIRUBIN TOTAL: 0.5 mg/dL (ref 0.3–1.2)
BUN: 17 mg/dL (ref 6–20)
CHLORIDE: 109 mmol/L (ref 101–111)
CO2: 23 mmol/L (ref 22–32)
Calcium: 8.7 mg/dL — ABNORMAL LOW (ref 8.9–10.3)
Creatinine, Ser: 0.67 mg/dL (ref 0.44–1.00)
Glucose, Bld: 93 mg/dL (ref 65–99)
POTASSIUM: 4.4 mmol/L (ref 3.5–5.1)
Sodium: 142 mmol/L (ref 135–145)
TOTAL PROTEIN: 7.5 g/dL (ref 6.5–8.1)

## 2017-08-22 LAB — CBC
HEMATOCRIT: 41.4 % (ref 36.0–46.0)
HEMOGLOBIN: 13.9 g/dL (ref 12.0–15.0)
MCH: 32.2 pg (ref 26.0–34.0)
MCHC: 33.6 g/dL (ref 30.0–36.0)
MCV: 95.8 fL (ref 78.0–100.0)
Platelets: 174 10*3/uL (ref 150–400)
RBC: 4.32 MIL/uL (ref 3.87–5.11)
RDW: 12.4 % (ref 11.5–15.5)
WBC: 7.2 10*3/uL (ref 4.0–10.5)

## 2017-08-22 LAB — I-STAT BETA HCG BLOOD, ED (MC, WL, AP ONLY): I-stat hCG, quantitative: <5 m[IU]/mL (ref <5)

## 2017-08-22 LAB — LIPASE, BLOOD: LIPASE: 28 U/L (ref 11–51)

## 2017-08-22 MED ORDER — ONDANSETRON 4 MG PO TBDP
4.0000 mg | ORAL_TABLET | Freq: Once | ORAL | Status: AC | PRN
  Administered 2017-08-22: 4 mg via ORAL
  Filled 2017-08-22: qty 1

## 2017-08-22 MED ORDER — AZITHROMYCIN 250 MG PO TABS: 250.0000 mg | ORAL_TABLET | Freq: Every day | ORAL | 0 refills | Status: DC

## 2017-08-22 MED ORDER — ONDANSETRON 4 MG PO TBDP: 4.0000 mg | ORAL_TABLET | Freq: Three times a day (TID) | ORAL | 0 refills | Status: DC | PRN

## 2017-08-22 NOTE — ED Provider Notes (Signed)
[facility]-EMERGENCY DEPT Provider Note   CSN: [id number] Arrival date & time: 08/22/17  0920     History   Chief Complaint Chief Complaint  Patient presents with  . Cough  . Emesis    HPI [name] is a 32 y.o. female.  HPI  2wks ago had cough, like normal cold, then 3-4 days ago it worsened Has been taking cough and cold medicine without relief Dyspnea with walking, feels fatigue. Fever was 101 3 days ago Still having chills, taking over the counter cough medicine and no longer has had fever Coughing up yellow sputum sometimes No hx of asthma, no smoking Vomited today, 3 times. Had nausea yesterday No diarrhea Had runny nose 2 weeks ago but now just cough NO OCPs, no hx of DVT/PE, no recent surgeries  History reviewed. No pertinent past medical history.  There are no active problems to display for this patient.   History reviewed. No pertinent surgical history.   OB History   None      Home Medications    Prior to Admission medications   Medication Sig Start Date End Date Taking? Authorizing Provider  Chlorphen-Phenyleph-ASA (ALKA-SELTZER PLUS COLD) 2-7.8-325 MG TBEF Take 2 tablets by mouth every 6 (six) hours as needed (COUGH AND COLD).   Yes [provider]  ibuprofen (ADVIL,MOTRIN) 200 MG tablet Take 400-600 mg by mouth every 6 (six) hours as needed for moderate pain.   Yes [provider]  azithromycin (ZITHROMAX) 250 MG tablet Take 1 tablet (250 mg total) by mouth daily. Take first 2 tablets together, then 1 every day until finished. 08/22/17   Alvira Monday, MD  ondansetron (ZOFRAN ODT) 4 MG disintegrating tablet Take 1 tablet (4 mg total) by mouth every 8 (eight) hours as needed for nausea or vomiting. 08/22/17   Alvira Monday, MD    Family History No family history on file.  Social History Social History   Tobacco Use  . Smoking status: Never Smoker  . Smokeless tobacco: Never Used  Substance  Use Topics  . Alcohol use: Yes  . Drug use: Not on file     Allergies   Penicillins   Review of Systems Review of Systems  Constitutional: Positive for appetite change, chills, fatigue and fever.  HENT: Negative for congestion, rhinorrhea and sore throat.   Eyes: Negative for visual disturbance.  Respiratory: Positive for cough and shortness of breath.   Cardiovascular: Positive for chest pain (with coughing only).  Gastrointestinal: Positive for nausea and vomiting. Negative for abdominal pain, constipation and diarrhea.  Genitourinary: Negative for difficulty urinating and dysuria.  Musculoskeletal: Negative for back pain and neck pain.  Skin: Negative for rash.  Neurological: Negative for syncope and headaches.     Physical Exam Updated Vital Signs BP (!) 121/98   Pulse (!) 107   Temp 98.2 F (36.8 C) (Oral)   Resp 16   LMP 08/15/2017   SpO2 100%   Physical Exam  Constitutional: She is oriented to person, place, and time. She appears well-developed and well-nourished. No distress.  HENT:  Head: Normocephalic and atraumatic.  Eyes: Conjunctivae and EOM are normal.  Neck: Normal range of motion.  Cardiovascular: Normal rate, regular rhythm, normal heart sounds and intact distal pulses. Exam reveals no gallop and no friction rub.  No murmur heard. Pulmonary/Chest: Effort normal and breath sounds normal. No respiratory distress. She has no wheezes. She has no rales.  Abdominal: Soft. She exhibits no distension. There is  no tenderness. There is no guarding.  Musculoskeletal: She exhibits no edema or tenderness.  Neurological: She is alert and oriented to person, place, and time.  Skin: Skin is warm and dry. No rash noted. She is not diaphoretic. No erythema.  Nursing note and vitals reviewed.    ED Treatments / Results  Labs (all labs ordered are listed, but only abnormal results are displayed) Labs Reviewed  COMPREHENSIVE METABOLIC PANEL - Abnormal; Notable for  the following components:      Result Value   Calcium 8.7 (*)    All other components within normal limits  LIPASE, BLOOD  CBC  I-STAT BETA HCG BLOOD, ED ([facility], [facility], [facility] ONLY)    EKG None  Radiology Dg Chest 2 View  Result Date: 08/22/2017 CLINICAL DATA:  Cough for 2 weeks, shortness of breath EXAM: CHEST - 2 VIEW COMPARISON:  06/01/2008 FINDINGS: Normal heart size, mediastinal contours, and pulmonary vascularity. Lungs clear. No pleural effusion or pneumothorax. Bones unremarkable. IMPRESSION: No acute abnormalities. Electronically Signed   By: Ulyses SouthwardMark  Boles M.D.   On: 08/22/2017 11:26    Procedures Procedures (including critical care time)  Medications Ordered in ED Medications  ondansetron (ZOFRAN-ODT) disintegrating tablet 4 mg (4 mg Oral Given 08/22/17 0932)     Initial Impression / Assessment and Plan / ED Course  I have reviewed the triage vital signs and the nursing notes.  Pertinent labs & imaging results that were available during my care of the patient were reviewed by me and considered in my medical decision making (see chart for details).     32 year old female with no significant medical history presents with concern for cough for 2 weeks, worsening over the last 3-4 days, with associated fever at home and emesis.  Abdominal exam benign, no abdominal pain, low suspicion for  Surgical intra-abdominal etiology of vomiting.  Pregnancy test negative.  Labs without significant abnormalities.  Chest x-ray shows no signs of pneumonia.  Doubt PE given more significant URI symptoms, reoprt of higher fever at home, no risk factors.  Overall, given history of URI symptoms with slight improvement followed by worsening, suspect bacterial superinfection.  Discussed with patient that her symptoms may be due to new viral URI, but cannot rule out other bacterial bronchitis by history.  Given prescription for azithromycin, and Zofran.  Recommend patient also continue other supportive  care.  Final Clinical Impressions(s) / ED Diagnoses   Final diagnoses:  Acute bacterial bronchitis    ED Discharge Orders        Ordered    ondansetron (ZOFRAN ODT) 4 MG disintegrating tablet  Every 8 hours PRN     08/22/17 1130    azithromycin (ZITHROMAX) 250 MG tablet  Daily     08/22/17 1130       Alvira MondaySchlossman, Jazmaine Fuelling, MD 08/22/17 1706

## 2017-08-22 NOTE — ED Notes (Signed)
Pt has been asked to obtain a urine sample, pt states "not right now".

## 2017-08-22 NOTE — ED Triage Notes (Signed)
Pt reports she has had cold symptoms for 2 weeks that have been off and on. This morning started vomiting. Denies diarrhea or urinary problems. Reports SOB with exertion and turning over in bed.

## 2017-10-18 ENCOUNTER — Emergency Department (HOSPITAL_COMMUNITY)
Admission: EM | Admit: 2017-10-18 | Discharge: 2017-10-19 | Discharge status: Against Medical Advice | Payer: Managed Care, Other (non HMO) | Attending: Emergency Medicine | Admitting: Emergency Medicine

## 2017-10-18 ENCOUNTER — Other Ambulatory Visit: Payer: Self-pay

## 2017-10-18 DIAGNOSIS — R197 Diarrhea, unspecified: Secondary | ICD-10-CM | POA: Diagnosis present

## 2017-10-18 DIAGNOSIS — Z5321 Procedure and treatment not carried out due to patient leaving prior to being seen by health care provider: Secondary | ICD-10-CM | POA: Diagnosis not present

## 2017-10-18 LAB — COMPREHENSIVE METABOLIC PANEL
ALT: 27 U/L (ref 14–54)
AST: 34 U/L (ref 15–41)
Albumin: 4.4 g/dL (ref 3.5–5.0)
Alkaline Phosphatase: 42 U/L (ref 38–126)
Anion gap: 10 (ref 5–15)
BILIRUBIN TOTAL: 0.6 mg/dL (ref 0.3–1.2)
BUN: 14 mg/dL (ref 6–20)
CALCIUM: 9.2 mg/dL (ref 8.9–10.3)
CHLORIDE: 104 mmol/L (ref 101–111)
CO2: 24 mmol/L (ref 22–32)
CREATININE: 0.81 mg/dL (ref 0.44–1.00)
Glucose, Bld: 98 mg/dL (ref 65–99)
Potassium: 4.5 mmol/L (ref 3.5–5.1)
Sodium: 138 mmol/L (ref 135–145)
TOTAL PROTEIN: 8 g/dL (ref 6.5–8.1)

## 2017-10-18 LAB — LIPASE, BLOOD: LIPASE: 32 U/L (ref 11–51)

## 2017-10-18 LAB — I-STAT BETA HCG BLOOD, ED (MC, WL, AP ONLY)

## 2017-10-18 LAB — CBC
HCT: 42 % (ref 36.0–46.0)
Hemoglobin: 14.2 g/dL (ref 12.0–15.0)
MCH: 32.3 pg (ref 26.0–34.0)
MCHC: 33.8 g/dL (ref 30.0–36.0)
MCV: 95.7 fL (ref 78.0–100.0)
PLATELETS: 187 10*3/uL (ref 150–400)
RBC: 4.39 MIL/uL (ref 3.87–5.11)
RDW: 12.5 % (ref 11.5–15.5)
WBC: 7.6 10*3/uL (ref 4.0–10.5)

## 2017-10-18 NOTE — ED Notes (Signed)
Pt called for room, no response from lobby 

## 2017-10-18 NOTE — ED Triage Notes (Signed)
Pt from home with c/o upper middle abd pain that began yesterday. Pt also reports complaints of diarrhea x 2 weeks. Pt states she had episodes of emesis x 2 days but none today. Pt currently reports nausea. Pt reports 4-5 episodes of diarrhea per day. Pt reports this started after she took antibiotics.

## 2017-10-19 NOTE — ED Notes (Signed)
Patient called and no answer. 

## 2017-10-19 NOTE — ED Notes (Signed)
Follow up call made  No answer  10/19/17  1116 s Eudell Julian rn

## 2017-11-18 ENCOUNTER — Encounter (HOSPITAL_COMMUNITY): Payer: Self-pay | Admitting: *Deleted

## 2017-11-18 ENCOUNTER — Emergency Department (HOSPITAL_COMMUNITY): Payer: Managed Care, Other (non HMO)

## 2017-11-18 ENCOUNTER — Other Ambulatory Visit: Payer: Self-pay

## 2017-11-18 ENCOUNTER — Emergency Department (HOSPITAL_COMMUNITY)
Admission: EM | Admit: 2017-11-18 | Discharge: 2017-11-18 | Disposition: A | Discharge status: Home / Self Care | Payer: Managed Care, Other (non HMO) | Attending: Emergency Medicine | Admitting: Emergency Medicine

## 2017-11-18 DIAGNOSIS — I10 Essential (primary) hypertension: Secondary | ICD-10-CM | POA: Diagnosis not present

## 2017-11-18 DIAGNOSIS — R112 Nausea with vomiting, unspecified: Secondary | ICD-10-CM | POA: Insufficient documentation

## 2017-11-18 DIAGNOSIS — R945 Abnormal results of liver function studies: Secondary | ICD-10-CM | POA: Insufficient documentation

## 2017-11-18 DIAGNOSIS — R748 Abnormal levels of other serum enzymes: Secondary | ICD-10-CM

## 2017-11-18 HISTORY — DX: Essential (primary) hypertension: I10

## 2017-11-18 LAB — COMPREHENSIVE METABOLIC PANEL
ALK PHOS: 35 U/L — AB (ref 38–126)
ALT: 85 U/L — ABNORMAL HIGH (ref 0–44)
ANION GAP: 10 (ref 5–15)
AST: 62 U/L — ABNORMAL HIGH (ref 15–41)
Albumin: 4.2 g/dL (ref 3.5–5.0)
BILIRUBIN TOTAL: 0.4 mg/dL (ref 0.3–1.2)
BUN: 15 mg/dL (ref 6–20)
CALCIUM: 8.8 mg/dL — AB (ref 8.9–10.3)
CO2: 25 mmol/L (ref 22–32)
Chloride: 107 mmol/L (ref 98–111)
Creatinine, Ser: 0.65 mg/dL (ref 0.44–1.00)
GFR calc non Af Amer: >60 mL/min (ref >60)
Glucose, Bld: 102 mg/dL — ABNORMAL HIGH (ref 70–99)
Potassium: 4 mmol/L (ref 3.5–5.1)
Sodium: 142 mmol/L (ref 135–145)
TOTAL PROTEIN: 7.9 g/dL (ref 6.5–8.1)

## 2017-11-18 LAB — URINALYSIS, ROUTINE W REFLEX MICROSCOPIC
BILIRUBIN URINE: NEGATIVE
Glucose, UA: NEGATIVE mg/dL
HGB URINE DIPSTICK: NEGATIVE
Ketones, ur: NEGATIVE mg/dL
Leukocytes, UA: NEGATIVE
Nitrite: NEGATIVE
Protein, ur: 30 mg/dL — AB
Specific Gravity, Urine: 1.024 (ref 1.005–1.030)
pH: 8 (ref 5.0–8.0)

## 2017-11-18 LAB — I-STAT BETA HCG BLOOD, ED (MC, WL, AP ONLY)

## 2017-11-18 LAB — CBC
HCT: 40.7 % (ref 36.0–46.0)
HEMOGLOBIN: 13.8 g/dL (ref 12.0–15.0)
MCH: 32 pg (ref 26.0–34.0)
MCHC: 33.9 g/dL (ref 30.0–36.0)
MCV: 94.4 fL (ref 78.0–100.0)
Platelets: 186 10*3/uL (ref 150–400)
RBC: 4.31 MIL/uL (ref 3.87–5.11)
RDW: 12.8 % (ref 11.5–15.5)
WBC: 6.7 10*3/uL (ref 4.0–10.5)

## 2017-11-18 LAB — LIPASE, BLOOD: Lipase: 23 U/L (ref 11–51)

## 2017-11-18 MED ORDER — ONDANSETRON 4 MG PO TBDP
4.0000 mg | ORAL_TABLET | Freq: Once | ORAL | Status: AC | PRN
  Administered 2017-11-18: 4 mg via ORAL
  Filled 2017-11-18: qty 1

## 2017-11-18 MED ORDER — ONDANSETRON 8 MG PO TBDP: 8.0000 mg | ORAL_TABLET | Freq: Three times a day (TID) | ORAL | 0 refills | Status: DC | PRN

## 2017-11-18 MED ORDER — SODIUM CHLORIDE 0.9 % IV BOLUS
1000.0000 mL | Freq: Once | INTRAVENOUS | Status: AC
  Administered 2017-11-18: 1000 mL via INTRAVENOUS

## 2017-11-18 NOTE — ED Notes (Signed)
Patient transported to Ultrasound 

## 2017-11-18 NOTE — Discharge Instructions (Addendum)
Zofran as prescribed as needed for nausea and vomiting.  Drink plenty of fluids.  Follow-up with family doctor this week.  Return if worsening symptoms.

## 2017-11-18 NOTE — ED Triage Notes (Signed)
Pt c/o abd pain x 3-4 weeks with diarrhea, now I'm nauseated and my stomach's not hurting.  My PCP wanted me to get a stool sample to check for c-diff but I work every day."

## 2017-11-18 NOTE — ED Provider Notes (Signed)
[facility]-EMERGENCY DEPT Provider Note   CSN: [id number] Arrival date & time: 11/18/17  4098     History   Chief Complaint Chief Complaint  Patient presents with  . Emesis  . Abdominal Pain    HPI [name] is a 32 y.o. female.  HPI [name] is a 32 y.o. female presents to emergency department with complaint of nausea and vomiting.  Patient states she has had diarrhea for several weeks.  She states she went to her doctor few days ago and was told to give a stool sample but she was unable to do so at that time.  She states the diarrhea improved yesterday however she did take Imodium.  She has not had any episodes of loose stools since yesterday morning.  She states this morning she woke up with nausea and vomiting, unable to keep anything down including water.  She denies any abdominal pain.  No blood in her stool or emesis.  She states her diarrhea was green and watery.  She states she did not take any medications for nausea prior to coming in.  Denies any urinary symptoms.  No vaginal discharge or bleeding.  She is not pregnant.  No sick contacts.  No recent travel or antibiotics.  Past Medical History:  Diagnosis Date  . Hypertension     There are no active problems to display for this patient.   History reviewed. No pertinent surgical history.   OB History   None      Home Medications    Prior to Admission medications   Medication Sig Start Date End Date Taking? Authorizing Provider  azithromycin (ZITHROMAX) 250 MG tablet Take 1 tablet (250 mg total) by mouth daily. Take first 2 tablets together, then 1 every day until finished. 08/22/17   Alvira Monday, MD  Chlorphen-Phenyleph-ASA (ALKA-SELTZER PLUS COLD) 2-7.8-325 MG TBEF Take 2 tablets by mouth every 6 (six) hours as needed (COUGH AND COLD).    [provider]  ibuprofen (ADVIL,MOTRIN) 200 MG tablet Take 400-600 mg by mouth every 6 (six) hours as needed for moderate pain.     [provider]  ondansetron (ZOFRAN ODT) 4 MG disintegrating tablet Take 1 tablet (4 mg total) by mouth every 8 (eight) hours as needed for nausea or vomiting. 08/22/17   Alvira Monday, MD    Family History No family history on file.  Social History Social History   Tobacco Use  . Smoking status: Never Smoker  . Smokeless tobacco: Never Used  Substance Use Topics  . Alcohol use: Yes  . Drug use: Not Currently     Allergies   Penicillins   Review of Systems Review of Systems  Constitutional: Negative for chills and fever.  Respiratory: Negative for cough, chest tightness and shortness of breath.   Cardiovascular: Negative for chest pain, palpitations and leg swelling.  Gastrointestinal: Positive for abdominal pain, diarrhea, nausea and vomiting.  Genitourinary: Negative for dysuria, flank pain, pelvic pain, vaginal bleeding, vaginal discharge and vaginal pain.  Musculoskeletal: Negative for arthralgias, myalgias, neck pain and neck stiffness.  Skin: Negative for rash.  Neurological: Negative for dizziness, weakness and headaches.  All other systems reviewed and are negative.    Physical Exam Updated Vital Signs BP (!) 139/98 (BP Location: Left Arm)   Pulse 97   Temp 98.6 F (37 C) (Oral)   Resp 18   Ht 5\' 2"  (1.575 m)   Wt 99.3 kg (219 lb)   LMP 11/11/2017 (  Approximate)   SpO2 99%   BMI 40.06 kg/m   Physical Exam  Constitutional: She appears well-developed and well-nourished. No distress.  HENT:  Head: Normocephalic.  Eyes: Conjunctivae are normal.  Neck: Neck supple.  Cardiovascular: Normal rate, regular rhythm and normal heart sounds.  Pulmonary/Chest: Effort normal and breath sounds normal. No respiratory distress. She has no wheezes. She has no rales.  Abdominal: Soft. Bowel sounds are normal. She exhibits no distension. There is no tenderness. There is no rebound.  Musculoskeletal: She exhibits no edema.  Neurological: She is alert.    Skin: Skin is warm and dry.  Psychiatric: She has a normal mood and affect. Her behavior is normal.  Nursing note and vitals reviewed.    ED Treatments / Results  Labs (all labs ordered are listed, but only abnormal results are displayed) Labs Reviewed  COMPREHENSIVE METABOLIC PANEL - Abnormal; Notable for the following components:      Result Value   Glucose, Bld 102 (*)    Calcium 8.8 (*)    AST 62 (*)    ALT 85 (*)    Alkaline Phosphatase 35 (*)    All other components within normal limits  URINALYSIS, ROUTINE W REFLEX MICROSCOPIC - Abnormal; Notable for the following components:   Protein, ur 30 (*)    Bacteria, UA RARE (*)    All other components within normal limits  LIPASE, BLOOD  CBC  I-STAT BETA HCG BLOOD, ED ([facility], [facility], [facility] ONLY)    EKG None  Radiology US Abdomen Complete  Result Date: 11/18/2017 CLINICAL DATA:  Abdominal pain, nausea and vomiting since last night. EXAM: ABDOMEN ULTRASOUND COMPLETE COMPARISON:  Abdominal ultrasound 06/11/2004. FINDINGS: Gallbladder: No gallstones or wall thickening visualized. No sonographic Murphy sign noted by sonographer. Common bile duct: Diameter: 0.3 cm Liver: No focal lesion identified. Within normal limits in parenchymal echogenicity. Portal vein is patent on color Doppler imaging with normal direction of blood flow towards the liver. IVC: No abnormality visualized. Pancreas: Visualized portion unremarkable. Spleen: Size and appearance within normal limits. Right Kidney: Length: 11.1 cm. Echogenicity within normal limits. No mass or hydronephrosis visualized. Left Kidney: Length: 10.4 cm. Echogenicity within normal limits. No mass or hydronephrosis visualized. Abdominal aorta: No aneurysm visualized. Other findings: None. IMPRESSION: Normal examination. Electronically Signed   By: Drusilla Kanner M.D.   On: 11/18/2017 09:41    Procedures Procedures (including critical care time)  Medications Ordered in ED Medications  sodium  chloride 0.9 % bolus 1,000 mL (has no administration in time range)  ondansetron (ZOFRAN-ODT) disintegrating tablet 4 mg (4 mg Oral Given 11/18/17 1610)     Initial Impression / Assessment and Plan / ED Course  I have reviewed the triage vital signs and the nursing notes.  Pertinent labs & imaging results that were available during my care of the patient were reviewed by me and considered in my medical decision making (see chart for details).     Patient with diarrhea for several weeks, no loose stools since yesterday morning, however started to have nausea and vomiting, unable to keep anything down.  Vital signs are normal.  Patient is in no acute distress.  Abdomen is soft and nontender.  Will check labs give IV fluids, give antiemetics, reassess.   Slightly elevated LFTs.  Patient reported some upper abdominal pain earlier.  I will get ultrasound of her abdomen to rule out cholecystitis.  10:45 AM Ultrasound is negative.  Patient is feeling better.  She is drinking ginger  ale.  Tolerating it well.  Most likely viral gastroenteritis, continues to have no abdominal pain or tenderness on exam.  I will discharge her home with antiemetics.  We discussed close follow-up with her doctor should recheck her LFTs.  Avoid Tylenol and alcohol.  Return if worsening symptoms.  Vitals:   11/18/17 0609 11/18/17 0830 11/18/17 0831 11/18/17 1032  BP:   131/88 112/89  Pulse:  98 98 90  Resp:   15 16  Temp:      TempSrc:      SpO2:  100% 100% 97%  Weight: 99.3 kg (219 lb)     Height: 5\' 2"  (1.575 m)        Final Clinical Impressions(s) / ED Diagnoses   Final diagnoses:  Non-intractable vomiting with nausea, unspecified vomiting type  Elevated liver enzymes    ED Discharge Orders        Ordered    ondansetron (ZOFRAN ODT) 8 MG disintegrating tablet  Every 8 hours PRN     11/18/17 1043       Jaynie CrumbleKirichenko, Lashan Macias, PA-C 11/18/17 1046    Jacalyn LefevreHaviland, Julie, MD 11/18/17 1402

## 2019-02-24 ENCOUNTER — Other Ambulatory Visit: Payer: Self-pay

## 2019-02-24 ENCOUNTER — Emergency Department (HOSPITAL_COMMUNITY)
Admission: EM | Admit: 2019-02-24 | Discharge: 2019-02-24 | Disposition: A | Discharge status: Home / Self Care | Payer: Managed Care, Other (non HMO) | Attending: Emergency Medicine | Admitting: Emergency Medicine

## 2019-02-24 ENCOUNTER — Encounter (HOSPITAL_COMMUNITY): Payer: Self-pay | Admitting: Emergency Medicine

## 2019-02-24 DIAGNOSIS — R112 Nausea with vomiting, unspecified: Secondary | ICD-10-CM | POA: Insufficient documentation

## 2019-02-24 DIAGNOSIS — Z5321 Procedure and treatment not carried out due to patient leaving prior to being seen by health care provider: Secondary | ICD-10-CM | POA: Insufficient documentation

## 2019-02-24 NOTE — ED Notes (Signed)
Does not respond when called to have VS rechecked.

## 2019-02-24 NOTE — ED Triage Notes (Signed)
Pt complaint of migraine onset yesterday with n/v last night.

## 2019-09-07 IMAGING — US US ABDOMEN COMPLETE
2 series · 14 of 25 positions shown · non-contrast
Comparison: Abdominal ultrasound 06/11/2004.

CLINICAL DATA: Abdominal pain, nausea and vomiting since last
night.

EXAM:
ABDOMEN ULTRASOUND COMPLETE

[Series 1: us abdomen complete · 4 of 62 slices shown (1 of 2)]
[im 1/62]
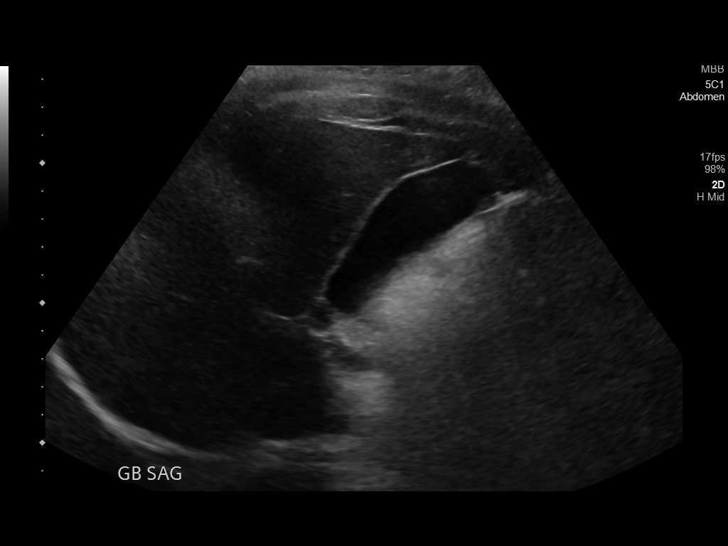
[im 18/62]
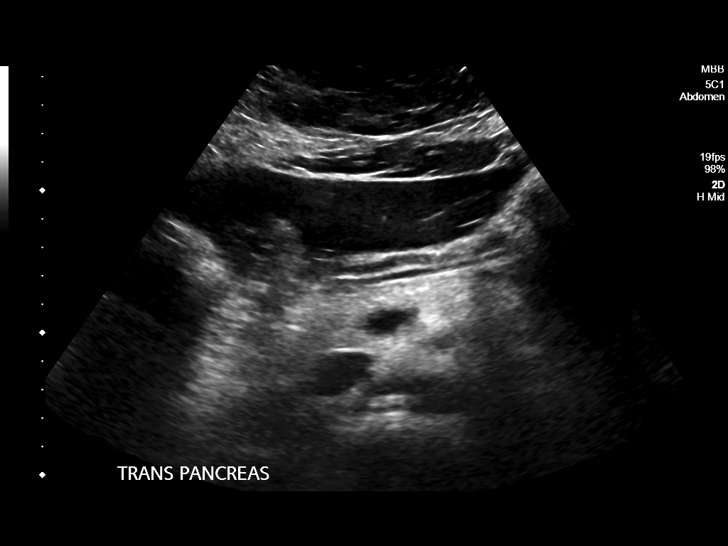
[im 35/62]
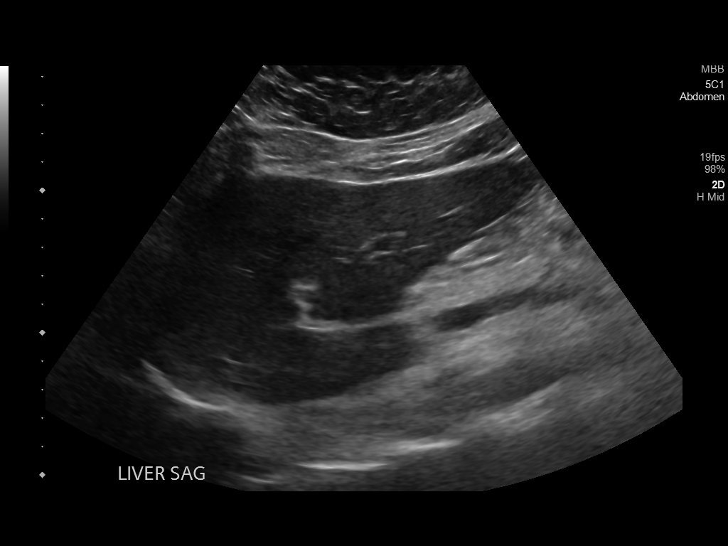
[im 53/62]
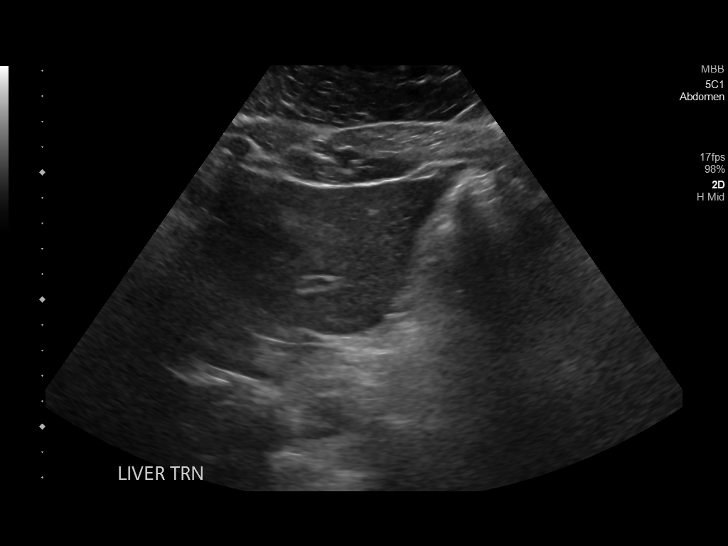

[Series 1: us abdomen complete · 0.26mm/px · 10 of 133 slices shown (2 of 2)]
[im 1/133]
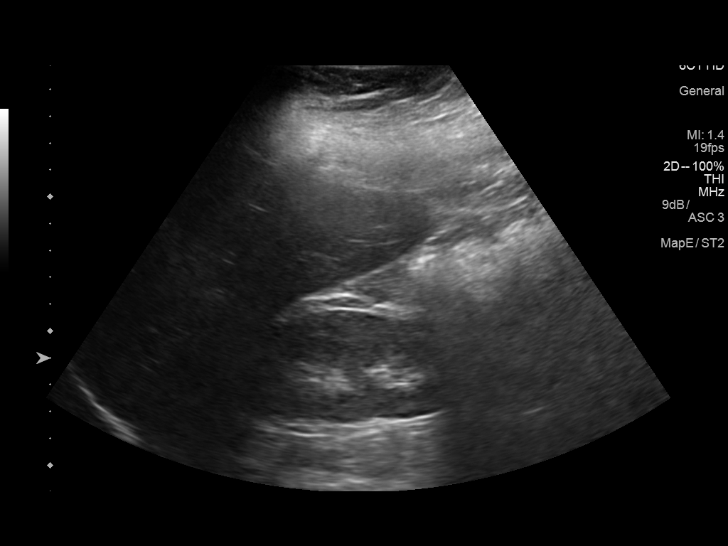
[im 9/133]
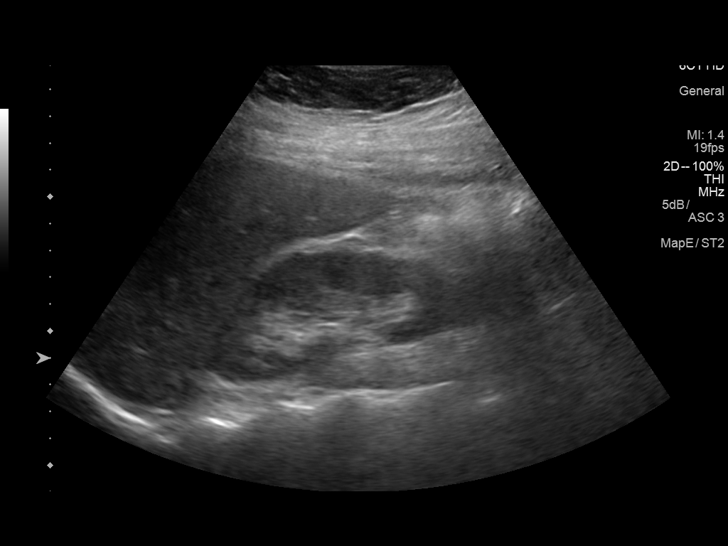
[im 25/133]
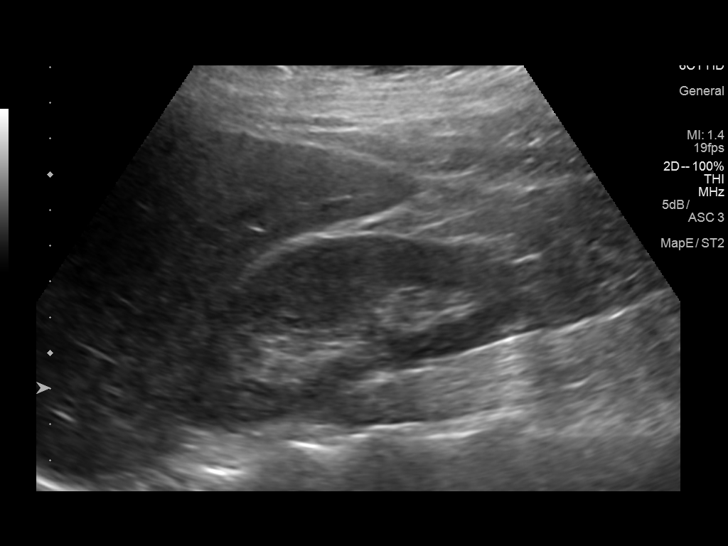
[im 42/133]
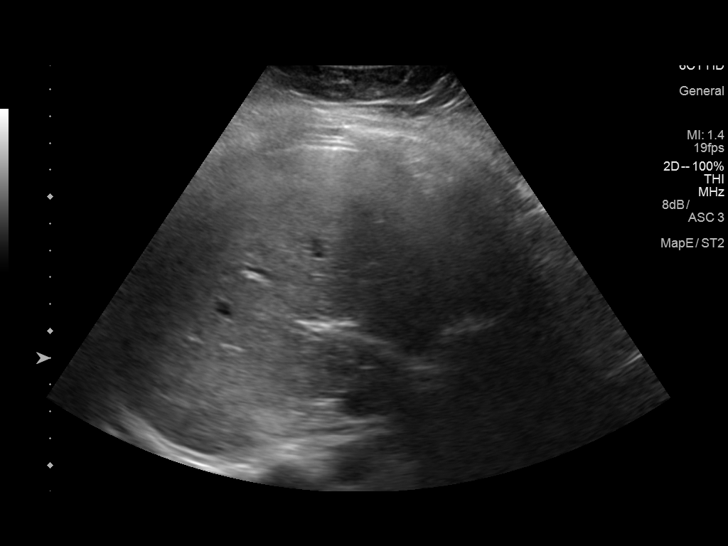
[im 58/133]
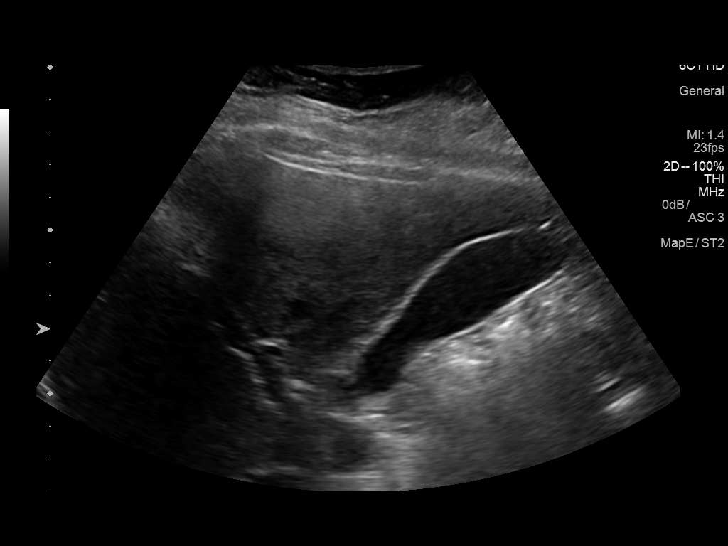
[im 67/133]
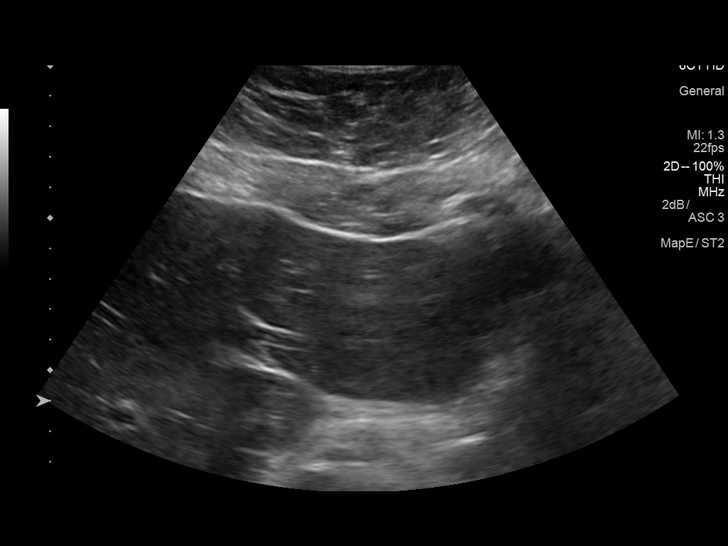
[im 83/133]
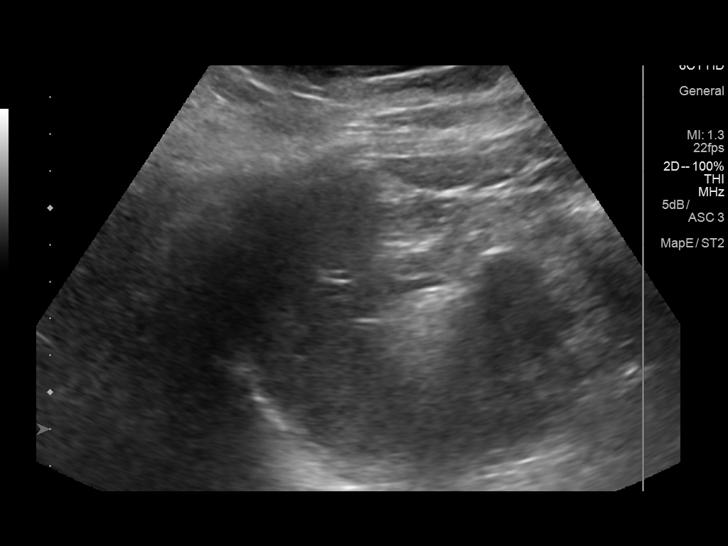
[im 100/133]
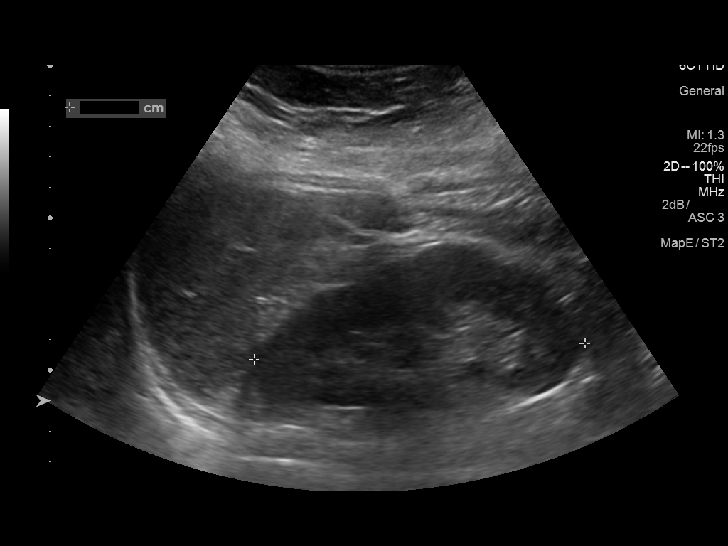
[im 116/133]
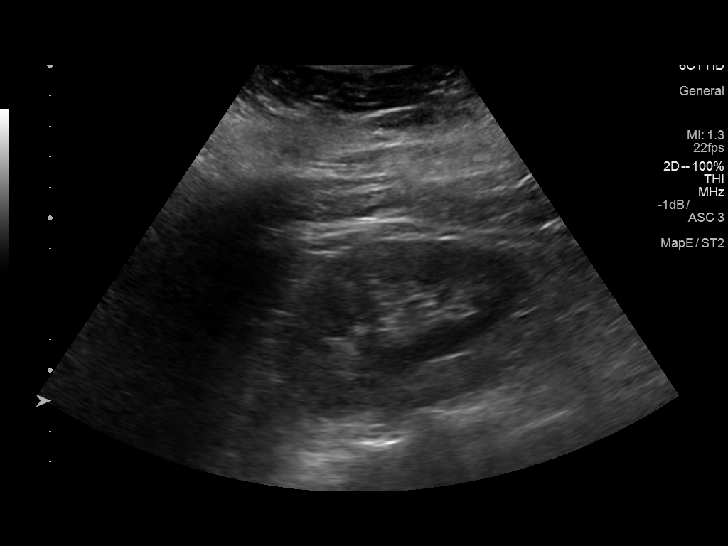
[im 133/133]
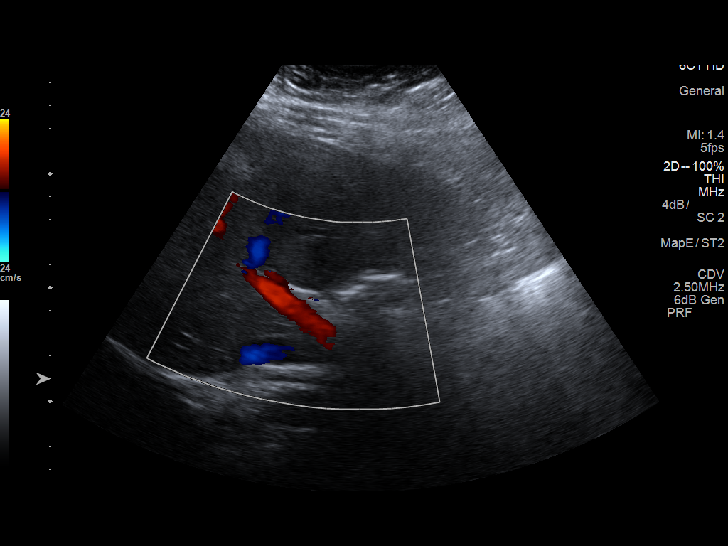

[14 of 25 positions shown; findings below may reference images not displayed]

FINDINGS: Gallbladder: No gallstones or wall thickening visualized. No
sonographic Murphy sign noted by sonographer.

Common bile duct: Diameter: 0.3 cm

Liver: No focal lesion identified. Within normal limits in
parenchymal echogenicity. Portal vein is patent on color Doppler
imaging with normal direction of blood flow towards the liver.

IVC: No abnormality visualized.

Pancreas: Visualized portion unremarkable.

Spleen: Size and appearance within normal limits.

Right Kidney: Length: 11.1 cm. Echogenicity within normal limits. No
mass or hydronephrosis visualized.

Left Kidney: Length: 10.4 cm. Echogenicity within normal limits. No
mass or hydronephrosis visualized.

Abdominal aorta: No aneurysm visualized.

Other findings: None.
IMPRESSION: Normal examination.

## 2020-09-28 ENCOUNTER — Encounter (HOSPITAL_BASED_OUTPATIENT_CLINIC_OR_DEPARTMENT_OTHER): Payer: Self-pay | Admitting: *Deleted

## 2020-09-28 ENCOUNTER — Other Ambulatory Visit: Payer: Self-pay

## 2020-09-28 ENCOUNTER — Emergency Department (HOSPITAL_BASED_OUTPATIENT_CLINIC_OR_DEPARTMENT_OTHER)
Admission: EM | Admit: 2020-09-28 | Discharge: 2020-09-28 | Disposition: A | Discharge status: Home / Self Care | Payer: Self-pay | Attending: Emergency Medicine | Admitting: Emergency Medicine

## 2020-09-28 ENCOUNTER — Other Ambulatory Visit (HOSPITAL_BASED_OUTPATIENT_CLINIC_OR_DEPARTMENT_OTHER): Payer: Self-pay

## 2020-09-28 DIAGNOSIS — R110 Nausea: Secondary | ICD-10-CM | POA: Insufficient documentation

## 2020-09-28 DIAGNOSIS — I10 Essential (primary) hypertension: Secondary | ICD-10-CM | POA: Insufficient documentation

## 2020-09-28 DIAGNOSIS — R630 Anorexia: Secondary | ICD-10-CM | POA: Insufficient documentation

## 2020-09-28 DIAGNOSIS — Z79899 Other long term (current) drug therapy: Secondary | ICD-10-CM | POA: Insufficient documentation

## 2020-09-28 DIAGNOSIS — R1013 Epigastric pain: Secondary | ICD-10-CM | POA: Insufficient documentation

## 2020-09-28 LAB — CBC WITH DIFFERENTIAL/PLATELET
Abs Immature Granulocytes: 0.02 10*3/uL (ref 0.00–0.07)
Basophils Absolute: 0 10*3/uL (ref 0.0–0.1)
Basophils Relative: 0 %
Eosinophils Absolute: 0.3 10*3/uL (ref 0.0–0.5)
Eosinophils Relative: 4 %
HCT: 45.1 % (ref 36.0–46.0)
Hemoglobin: 15.2 g/dL — ABNORMAL HIGH (ref 12.0–15.0)
Immature Granulocytes: 0 %
Lymphocytes Relative: 6 %
Lymphs Abs: 0.5 10*3/uL — ABNORMAL LOW (ref 0.7–4.0)
MCH: 32 pg (ref 26.0–34.0)
MCHC: 33.7 g/dL (ref 30.0–36.0)
MCV: 94.9 fL (ref 80.0–100.0)
Monocytes Absolute: 0.5 10*3/uL (ref 0.1–1.0)
Monocytes Relative: 6 %
Neutro Abs: 7.4 10*3/uL (ref 1.7–7.7)
Neutrophils Relative %: 84 %
Platelets: 159 10*3/uL (ref 150–400)
RBC: 4.75 MIL/uL (ref 3.87–5.11)
RDW: 12.2 % (ref 11.5–15.5)
WBC: 8.9 10*3/uL (ref 4.0–10.5)
nRBC: 0 % (ref 0.0–0.2)

## 2020-09-28 LAB — COMPREHENSIVE METABOLIC PANEL
ALT: 19 U/L (ref 0–44)
AST: 20 U/L (ref 15–41)
Albumin: 4.6 g/dL (ref 3.5–5.0)
Alkaline Phosphatase: 46 U/L (ref 38–126)
Anion gap: 11 (ref 5–15)
BUN: 14 mg/dL (ref 6–20)
CO2: 24 mmol/L (ref 22–32)
Calcium: 9.2 mg/dL (ref 8.9–10.3)
Chloride: 101 mmol/L (ref 98–111)
Creatinine, Ser: 0.69 mg/dL (ref 0.44–1.00)
GFR, Estimated: >60 mL/min (ref >60)
Glucose, Bld: 96 mg/dL (ref 70–99)
Potassium: 4.1 mmol/L (ref 3.5–5.1)
Sodium: 136 mmol/L (ref 135–145)
Total Bilirubin: 0.6 mg/dL (ref 0.3–1.2)
Total Protein: 8.6 g/dL — ABNORMAL HIGH (ref 6.5–8.1)

## 2020-09-28 LAB — URINALYSIS, ROUTINE W REFLEX MICROSCOPIC
Bilirubin Urine: NEGATIVE
Glucose, UA: NEGATIVE mg/dL
Hgb urine dipstick: NEGATIVE
Ketones, ur: NEGATIVE mg/dL
Leukocytes,Ua: NEGATIVE
Nitrite: NEGATIVE
Protein, ur: NEGATIVE mg/dL
Specific Gravity, Urine: 1.015 (ref 1.005–1.030)
pH: 6 (ref 5.0–8.0)

## 2020-09-28 LAB — PREGNANCY, URINE: Preg Test, Ur: NEGATIVE

## 2020-09-28 LAB — LIPASE, BLOOD: Lipase: 33 U/L (ref 11–51)

## 2020-09-28 MED ORDER — ONDANSETRON 4 MG PO TBDP
ORAL_TABLET | ORAL | 0 refills | Status: DC
  Filled 2020-09-28: qty 10, 2d supply, fill #0

## 2020-09-28 MED ORDER — LIDOCAINE VISCOUS HCL 2 % MT SOLN
15.0000 mL | Freq: Once | OROMUCOSAL | Status: AC
  Administered 2020-09-28: 15 mL via ORAL
  Filled 2020-09-28: qty 15

## 2020-09-28 MED ORDER — SUCRALFATE 1 GM/10ML PO SUSP
1.0000 g | Freq: Three times a day (TID) | ORAL | 0 refills | Status: DC
  Filled 2020-09-28: qty 420, 11d supply, fill #0

## 2020-09-28 MED ORDER — SUCRALFATE 1 GM/10ML PO SUSP
1.0000 g | Freq: Once | ORAL | Status: AC
  Administered 2020-09-28: 1 g via ORAL
  Filled 2020-09-28: qty 10

## 2020-09-28 MED ORDER — ONDANSETRON HCL 4 MG/2ML IJ SOLN
4.0000 mg | Freq: Once | INTRAMUSCULAR | Status: AC
  Administered 2020-09-28: 4 mg via INTRAVENOUS
  Filled 2020-09-28: qty 2

## 2020-09-28 MED ORDER — ALUM & MAG HYDROXIDE-SIMETH 200-200-20 MG/5ML PO SUSP
30.0000 mL | Freq: Once | ORAL | Status: AC
  Administered 2020-09-28: 30 mL via ORAL
  Filled 2020-09-28: qty 30

## 2020-09-28 MED ORDER — PANTOPRAZOLE SODIUM 40 MG IV SOLR
40.0000 mg | Freq: Once | INTRAVENOUS | Status: AC
  Administered 2020-09-28: 40 mg via INTRAVENOUS
  Filled 2020-09-28: qty 40

## 2020-09-28 MED ORDER — MORPHINE SULFATE (PF) 4 MG/ML IV SOLN
4.0000 mg | Freq: Once | INTRAVENOUS | Status: AC
  Administered 2020-09-28: 4 mg via INTRAVENOUS
  Filled 2020-09-28: qty 1

## 2020-09-28 MED ORDER — OMEPRAZOLE 20 MG PO CPDR
20.0000 mg | DELAYED_RELEASE_CAPSULE | Freq: Two times a day (BID) | ORAL | 0 refills | Status: DC
  Filled 2020-09-28: qty 60, 30d supply, fill #0

## 2020-09-28 NOTE — ED Notes (Signed)
Pt states very minimal pain at this time, feels relief from medications.  Resting comfortably

## 2020-09-28 NOTE — Discharge Instructions (Signed)
Your lab work today is reassuring.  I have high suspicion that you may have gastritis or an ulcer causing your symptoms.  Use omeprazole twice daily before breakfast and dinner, take Carafate before meals and bedtime to help coat your stomach, use Zofran as needed for nausea and vomiting.  Maalox as needed for breakthrough symptoms.  Avoid NSAIDs like ibuprofen, aspirin, Aleve, Goody powder, and avoid alcohol.  Use eating guide to help limit foods that may worsen symptoms.  Please call to schedule close follow-up with GI.  Return for new or worsening symptoms such as new or worsening abdominal pain, vomiting, bloody or dark black stools or any other new or concerning symptoms.

## 2020-09-28 NOTE — ED Notes (Signed)
ED Provider at bedside. 

## 2020-09-28 NOTE — ED Notes (Signed)
Pt states epigastric abdominal pain x 2 days, nausea and diarrhea started this morning, took pepcid 40 mg and pepto with no relief.

## 2020-09-28 NOTE — ED Provider Notes (Signed)
[facility] EMERGENCY DEPARTMENT Provider Note   CSN: [id number] Arrival date & time: 09/28/20  1245     History Chief Complaint  Patient presents with  . Abdominal Pain    [name] is a 35 y.o. female.  [name] is a 35 y.o. female with history of HTN, who presents with epigastric abdominal pain. Patient reports symptoms started 3 days ago. Reports some associated nausea and decreased appetitie but no vomiting. Patient reports pepcid, which she typically takes daily, was initially helping, but today has not helped with symptoms at all. No diarrhea, blood in stool or melena. No urinary symptoms. No chest pain or shortness of breath. Dose report frequent NSAID use and almost daily alcohol use.        Past Medical History:  Diagnosis Date  . Hypertension     There are no problems to display for this patient.   History reviewed. No pertinent surgical history.   OB History   No obstetric history on file.     No family history on file.  Social History   Tobacco Use  . Smoking status: Never Smoker  . Smokeless tobacco: Never Used  Vaping Use  . Vaping Use: Never used  Substance Use Topics  . Alcohol use: Yes  . Drug use: Not Currently    Home Medications Prior to Admission medications   Medication Sig Start Date End Date Taking? Authorizing Provider  cetirizine (ZYRTEC) 10 MG tablet Take by mouth.   Yes [provider]  Famotidine (PEPCID PO) Take by mouth.   Yes [provider]  azithromycin (ZITHROMAX) 250 MG tablet Take 1 tablet (250 mg total) by mouth daily. Take first 2 tablets together, then 1 every day until finished. Patient not taking: No sig reported 08/22/17   Alvira Monday, MD  Chlorpheniramine Maleate (ALLERGY PO) Take 1 tablet by mouth daily.    [provider]  hydrochlorothiazide (HYDRODIURIL) 25 MG tablet Take 12.5 mg by mouth daily.     [provider]  ibuprofen (ADVIL,MOTRIN) 200 MG  tablet Take 400-600 mg by mouth every 6 (six) hours as needed for moderate pain.    [provider]  ondansetron (ZOFRAN ODT) 8 MG disintegrating tablet Take 1 tablet (8 mg total) by mouth every 8 (eight) hours as needed for nausea or vomiting. 11/18/17   Kirichenko, Lemont Fillers, PA-C    Allergies    Penicillins  Review of Systems   Review of Systems  Constitutional: Negative for chills and fever.  HENT: Negative.   Respiratory: Negative for cough and shortness of breath.   Cardiovascular: Negative for chest pain.  Gastrointestinal: Positive for abdominal pain and nausea. Negative for blood in stool, constipation, diarrhea and vomiting.  Genitourinary: Negative for dysuria, frequency, vaginal bleeding and vaginal discharge.  Musculoskeletal: Negative for arthralgias and myalgias.  Skin: Negative for color change and rash.  All other systems reviewed and are negative.   Physical Exam Updated Vital Signs BP 133/87 (BP Location: Right Arm)   Pulse 97   Temp 98.5 F (36.9 C) (Oral)   Resp 18   Ht 5\' 2"  (1.575 m)   Wt 102.5 kg   LMP 09/22/2020   SpO2 98%   BMI 41.34 kg/m   Physical Exam Vitals and nursing note reviewed.  Constitutional:      General: She is not in acute distress.    Appearance: She is well-developed. She is obese. She is not ill-appearing or diaphoretic.  HENT:  Head: Normocephalic and atraumatic.  Eyes:     General:        Right eye: No discharge.        Left eye: No discharge.     Pupils: Pupils are equal, round, and reactive to light.  Cardiovascular:     Rate and Rhythm: Normal rate and regular rhythm.     Heart sounds: Normal heart sounds.  Pulmonary:     Effort: Pulmonary effort is normal. No respiratory distress.     Breath sounds: Normal breath sounds. No wheezing or rales.     Comments: Respirations equal and unlabored, patient able to speak in full sentences, lungs clear to auscultation bilaterally  Abdominal:     General: Bowel sounds  are normal. There is no distension.     Palpations: Abdomen is soft. There is no mass.     Tenderness: There is abdominal tenderness in the epigastric area. There is no guarding.     Comments: Abdomen is soft, non-distended, bowel sounds present throughout, there is tenderness in epigastric region, with some mild LUQ tenderness as well, no lower abdominal tenderness, no guarding or peritoneal signs  Musculoskeletal:        General: No deformity.     Cervical back: Neck supple.  Skin:    General: Skin is warm and dry.     Capillary Refill: Capillary refill takes less than 2 seconds.  Neurological:     Mental Status: She is alert.     Coordination: Coordination normal.     Comments: Speech is clear, able to follow commands Moves extremities without ataxia, coordination intact  Psychiatric:        Mood and Affect: Mood normal.        Behavior: Behavior normal.     ED Results / Procedures / Treatments   Labs (all labs ordered are listed, but only abnormal results are displayed) Labs Reviewed  COMPREHENSIVE METABOLIC PANEL - Abnormal; Notable for the following components:      Result Value   Total Protein 8.6 (*)    All other components within normal limits  CBC WITH DIFFERENTIAL/PLATELET - Abnormal; Notable for the following components:   Hemoglobin 15.2 (*)    Lymphs Abs 0.5 (*)    All other components within normal limits  LIPASE, BLOOD  URINALYSIS, ROUTINE W REFLEX MICROSCOPIC  PREGNANCY, URINE    EKG EKG Interpretation  Date/Time:  Tuesday Sep 28 2020 13:02:44 EDT Ventricular Rate:  101 PR Interval:  154 QRS Duration: 68 QT Interval:  342 QTC Calculation: 443 R Axis:   46 Text Interpretation: Sinus tachycardia Low voltage QRS Borderline ECG Confirmed by Alvester Chou [phone]) on 09/28/2020 2:03:25 PM   Radiology No results found.  Procedures Procedures   Medications Ordered in ED Medications  pantoprazole (PROTONIX) injection 40 mg (40 mg Intravenous Given  09/28/20 1523)  alum & mag hydroxide-simeth (MAALOX/MYLANTA) 200-200-20 MG/5ML suspension 30 mL (30 mLs Oral Given 09/28/20 1500)    And  lidocaine (XYLOCAINE) 2 % viscous mouth solution 15 mL (15 mLs Oral Given 09/28/20 1500)  ondansetron (ZOFRAN) injection 4 mg (4 mg Intravenous Given 09/28/20 1556)  morphine 4 MG/ML injection 4 mg (4 mg Intravenous Given 09/28/20 1556)  sucralfate (CARAFATE) 1 GM/10ML suspension 1 g (1 g Oral Given 09/28/20 1555)    ED Course  I have reviewed the triage vital signs and the nursing notes.  Pertinent labs & imaging results that were available during my care of the patient were reviewed by  me and considered in my medical decision making (see chart for details).    MDM Rules/Calculators/A&P                         Patient presents to the ED with complaints of abdominal pain. Patient nontoxic appearing, in no apparent distress, vitals WNL. On exam patient tender to palpation in epigastric region, no peritoneal signs. Will evaluate with labs. Do not feel that imaging is indicated based on exam and history.  Analgesics, anti-emetics, and fluids administered.   Additional history obtained:  Additional history obtained from chart review & nursing note review.   Lab Tests:  I Ordered, reviewed, and interpreted labs, which included:  CBC: No leukocytosis, no anemia CMP: No electrolyte derrangements, normal renal and liver function Lipase: WNL UA: No signs of infection, no hematuria Preg test: Negative  ED Course:   RE-EVAL: Pain improved  On repeat abdominal exam patient remains without peritoneal signs, low suspicion for cholecystitis, pancreatitis, diverticulitis, appendicitis, bowel obstruction/perforation,  PID, ectopic pregnancy, or other acute surgical process. Patient tolerating PO in the emergency department. Will discharge home with supportive measures. I discussed results, treatment plan, need for PCP and GI follow-up, and return precautions with the  patient. Will start treatment for potential ulcer or gastritis, discussed dietary modifications and avoiding NSAIDs and alcohol. Provided opportunity for questions, patient confirmed understanding and is in agreement with plan.    Portions of this note were generated with Scientist, clinical (histocompatibility and immunogenetics). Dictation errors may occur despite best attempts at proofreading.      Final Clinical Impression(s) / ED Diagnoses Final diagnoses:  Epigastric pain    Rx / DC Orders ED Discharge Orders         Ordered    omeprazole (PRILOSEC) 20 MG capsule  2 times daily before meals        09/28/20 1707    ondansetron (ZOFRAN ODT) 4 MG disintegrating tablet        09/28/20 1707    sucralfate (CARAFATE) 1 GM/10ML suspension  3 times daily with meals & bedtime        09/28/20 1707           Dartha Lodge, PA-C 10/01/20 0248    Terald Sleeper, MD 10/01/20 [phone]

## 2020-09-28 NOTE — ED Triage Notes (Signed)
Epigastric pain x 3 days. Hx of GERD. Her Pepcid has been helping until today. Nausea. No diarrhea.

## 2020-09-29 ENCOUNTER — Other Ambulatory Visit (HOSPITAL_BASED_OUTPATIENT_CLINIC_OR_DEPARTMENT_OTHER): Payer: Self-pay

## 2021-01-25 ENCOUNTER — Ambulatory Visit: Payer: 59 | Admitting: Nurse Practitioner

## 2021-01-25 ENCOUNTER — Other Ambulatory Visit: Payer: Self-pay

## 2021-01-25 ENCOUNTER — Other Ambulatory Visit (HOSPITAL_BASED_OUTPATIENT_CLINIC_OR_DEPARTMENT_OTHER): Payer: Self-pay

## 2021-01-25 ENCOUNTER — Encounter: Payer: Self-pay | Admitting: Nurse Practitioner

## 2021-01-25 VITALS — BP 124/86 | HR 99 | Temp 97.6°F | Ht 61.2 in | Wt 242.0 lb

## 2021-01-25 DIAGNOSIS — F3289 Other specified depressive episodes: Secondary | ICD-10-CM | POA: Diagnosis not present

## 2021-01-25 DIAGNOSIS — Z6841 Body Mass Index (BMI) 40.0 and over, adult: Secondary | ICD-10-CM

## 2021-01-25 DIAGNOSIS — Z7689 Persons encountering health services in other specified circumstances: Secondary | ICD-10-CM | POA: Diagnosis not present

## 2021-01-25 LAB — POCT URINALYSIS DIPSTICK
Bilirubin, UA: NEGATIVE
Blood, UA: NEGATIVE
Glucose, UA: NEGATIVE
Ketones, UA: NEGATIVE
Leukocytes, UA: NEGATIVE
Nitrite, UA: NEGATIVE
Protein, UA: NEGATIVE
Spec Grav, UA: 1.025 (ref 1.010–1.025)
Urobilinogen, UA: 1 E.U./dL
pH, UA: 6.5 (ref 5.0–8.0)

## 2021-01-25 MED ORDER — ESCITALOPRAM OXALATE 10 MG PO TABS
10.0000 mg | ORAL_TABLET | Freq: Every day | ORAL | 2 refills | Status: DC
  Filled 2021-01-25: qty 30, 30d supply, fill #0

## 2021-01-25 NOTE — Addendum Note (Signed)
Addended by: Randa Lynn T on: 01/25/2021 04:52 PM   Modules accepted: Orders

## 2021-01-25 NOTE — Patient Instructions (Signed)

## 2021-01-25 NOTE — Progress Notes (Signed)
[name],[name],acting as a Neurosurgeon for [employer], NP.,have documented all relevant documentation on the behalf of [employer], NP,as directed by  Christine Ivory, NP while in the presence of Christine Ivory, NP.  This visit occurred during the SARS-CoV-2 public health emergency.  Safety protocols were in place, including screening questions prior to the visit, additional usage of staff PPE, and extensive cleaning of exam room while observing appropriate contact time as indicated for disinfecting solutions.  Subjective:     Patient ID: [name] , female    DOB: 1986/01/03 , 35 y.o.   MRN: [id number]   Chief Complaint  Patient presents with  . Establish Care    HPI  Patient is here today to establish care.  She would like to discuss her issues with ongoing back pain today. She would also like a refill of her depression medication. 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 Her wife has two kids that she claim as her own. She was seeing a psychiatrist [name] and was on lexapro. She has a history of depression and anxiety and has been hospitalized. She denies any suicidal thoughts today. She states that she is aware of the suicide crisis line and where to get help if she was to ever need it.     Past Medical History:  Diagnosis Date  . Hypertension      Family History  Problem Relation Age of Onset  . Hypertension Mother   . Healthy Sister   . Hypertension Maternal Grandmother   . Diabetes Maternal Grandmother      Current Outpatient Medications:  .  cetirizine (ZYRTEC) 10 MG tablet, Take by mouth., Disp: , Rfl:  .  Famotidine (PEPCID PO), Take by mouth., Disp: , Rfl:  .  escitalopram (LEXAPRO) 10 MG tablet, Take 1 tablet (10 mg total) by mouth daily., Disp: 30 tablet, Rfl: 2   Allergies  Allergen Reactions  . Penicillins Rash    Has patient had a PCN reaction causing immediate rash, facial/tongue/throat swelling, SOB or lightheadedness with  hypotension: No Has patient had a PCN reaction causing severe rash involving mucus membranes or skin necrosis: No Has patient had a PCN reaction that required hospitalization: No Has patient had a PCN reaction occurring within the last 10 years: No If all of the above answers are "NO", then may proceed with Cephalosporin use.     Review of Systems  Constitutional:  Negative for chills, fatigue and fever.  Respiratory:  Negative for cough and wheezing.   Cardiovascular:  Negative for chest pain and palpitations.  Gastrointestinal:  Negative for constipation and diarrhea.  Musculoskeletal:  Positive for back pain.  Psychiatric/Behavioral:  Negative for sleep disturbance and suicidal ideas.     Today's Vitals   01/25/21 1443  BP: 124/86  Pulse: 99  Temp: 97.6 F (36.4 C)  TempSrc: Oral  Weight: 242 lb (109.8 kg)  Height: 5' 1.2" (1.554 m)   Body mass index is 45.43 kg/m.  Wt Readings from Last 3 Encounters:  01/25/21 242 lb (109.8 kg)  09/28/20 226 lb (102.5 kg)  11/18/17 219 lb (99.3 kg)    Objective:  Physical Exam Constitutional:      Appearance: Normal appearance.  HENT:     Head: Normocephalic and atraumatic.  Cardiovascular:     Rate and Rhythm: Normal rate and regular rhythm.     Pulses: Normal pulses.     Heart sounds: Normal heart sounds. No murmur heard. Pulmonary:  Effort: Pulmonary effort is normal. No respiratory distress.     Breath sounds: Normal breath sounds. No wheezing.  Skin:    General: Skin is warm and dry.     Capillary Refill: Capillary refill takes less than 2 seconds.  Neurological:     Mental Status: She is alert and oriented to person, place, and time.        Assessment And Plan:     1. Establishing care with new doctor, encounter for --Patient is here to establish care. [name] over patient medical, family, social and surgical history. -Reviewed with patient their medications and any allergies  -Reviewed with patient their sexual  orientation, drug/tobacco and alcohol use -Dicussed any new concerns with patient  -recommended patient comes in for a physical exam and complete blood work.  -Educated patient about the importance of annual screenings and immunizations.  -Advised patient to eat a healthy diet along with exercise for atleast 30-45 min atleast 4-5 days of the week.   2. Other depression -the patient has a history of depression from her history  -She denies suicidal thoughts  -She is aware of the resources available to her. She is aware of the crisis line that is available for her.  - Ambulatory referral to Psychiatry - Ambulatory referral to Psychology - escitalopram (LEXAPRO) 10 MG tablet; Take 1 tablet (10 mg total) by mouth daily.  Dispense: 30 tablet; Refill: 2  3. Class 3 severe obesity due to excess calories without serious comorbidity with body mass index (BMI) of 45.0 to 49.9 in adult [facility])  Advised patient on a healthy diet including avoiding fast food and red meats. Increase the intake of lean meats including grilled chicken and [place].  Drink a lot of water. Decrease intake of fatty foods. Exercise for 30-45 min. 4-5 a week to decrease the risk of cardiac event.   Follow up: if symptoms persist or do not get better.   The patient was encouraged to call or send a message through MyChart for any questions or concerns.   Side effects and appropriate use of all the medication(s) were discussed with the patient today. Patient advised to use the medication(s) as directed by their healthcare provider. The patient was encouraged to read, review, and understand all associated package inserts and contact our office with any questions or concerns. The patient accepts the risks of the treatment plan and had an opportunity to ask questions.    Patient was given opportunity to ask questions. Patient verbalized understanding of the plan and was able to repeat key elements of the plan. All questions were answered  to their satisfaction.  Christine Lissette Schenk, DNP   [name], [name] have reviewed all documentation for this visit. The documentation on 01/25/21 for the exam, diagnosis, procedures, and orders are all accurate and complete.    IF YOU HAVE BEEN REFERRED TO A SPECIALIST, IT MAY TAKE 1-2 WEEKS TO SCHEDULE/PROCESS THE REFERRAL. IF YOU HAVE NOT HEARD FROM US/SPECIALIST IN TWO WEEKS, PLEASE GIVE [place] A CALL AT [phone] X [phone].   THE PATIENT IS ENCOURAGED TO PRACTICE SOCIAL DISTANCING DUE TO THE COVID-19 PANDEMIC.

## 2021-02-03 ENCOUNTER — Other Ambulatory Visit (HOSPITAL_BASED_OUTPATIENT_CLINIC_OR_DEPARTMENT_OTHER): Payer: Self-pay

## 2021-02-15 ENCOUNTER — Ambulatory Visit: Payer: 59 | Admitting: Psychology

## 2021-02-24 ENCOUNTER — Ambulatory Visit (INDEPENDENT_AMBULATORY_CARE_PROVIDER_SITE_OTHER): Payer: 59 | Admitting: Psychology

## 2021-02-24 DIAGNOSIS — F331 Major depressive disorder, recurrent, moderate: Secondary | ICD-10-CM

## 2021-03-07 DIAGNOSIS — J069 Acute upper respiratory infection, unspecified: Secondary | ICD-10-CM | POA: Diagnosis not present

## 2021-03-08 DIAGNOSIS — B349 Viral infection, unspecified: Secondary | ICD-10-CM | POA: Diagnosis not present

## 2021-03-08 DIAGNOSIS — J101 Influenza due to other identified influenza virus with other respiratory manifestations: Secondary | ICD-10-CM | POA: Diagnosis not present

## 2021-03-10 ENCOUNTER — Encounter: Payer: 59 | Admitting: Nurse Practitioner

## 2021-03-15 ENCOUNTER — Encounter (HOSPITAL_COMMUNITY): Payer: Self-pay | Admitting: Psychiatry

## 2021-03-15 ENCOUNTER — Ambulatory Visit (HOSPITAL_BASED_OUTPATIENT_CLINIC_OR_DEPARTMENT_OTHER): Payer: 59 | Admitting: Psychiatry

## 2021-03-15 ENCOUNTER — Other Ambulatory Visit: Payer: Self-pay

## 2021-03-15 VITALS — BP 129/90 | HR 93 | Temp 98.3°F | Resp 18 | Ht 62.0 in | Wt 242.0 lb

## 2021-03-15 DIAGNOSIS — F331 Major depressive disorder, recurrent, moderate: Secondary | ICD-10-CM

## 2021-03-15 MED ORDER — DULOXETINE HCL 30 MG PO CPEP: 30.0000 mg | ORAL_CAPSULE | Freq: Every day | ORAL | 1 refills | Status: DC

## 2021-03-15 MED ORDER — ARIPIPRAZOLE 5 MG PO TABS: 5.0000 mg | ORAL_TABLET | Freq: Every day | ORAL | 1 refills | Status: DC

## 2021-03-15 NOTE — Progress Notes (Signed)
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  Married, employed as Charity fundraiser .  Has two  stepchildren ages 94 and 62. She reports a history of mood disorder/depression.  She describes depression as chronic but waxing and waning over time.  She does not feel that it corresponds specifically to any stressor or external situation and states that in general her life is currently stable without significant stressors. She had been prescribed Lexapro for depression but states she stopped it about 2 to 3 months ago because she felt it was not working.  At this time she is not taking any medications. In addition to depression she also describes anxiety.  Mainly describes it as a tendency to worry excessively, even about relatively minor issues.  She does have a past history of some panic attacks but not recently.  Does not endorse agoraphobia. She denies any history of psychosis and no psychotic symptoms are noted. She denies alcohol or drug abuse.  With regards to depression she describes it as chronic,and feels she has been experiencing depression to varying degrees of severity for several years. She was admitted to a psychiatric inpatient unit in 2017 ( at the time was in the [employer]) for worsening depression and suicide attempt by cutting self  ( has visible scars on forearms from this event)   She endorses neuro-vegetative symptoms as below She reports chronic/intermittent passive thoughts of death which have been occurring for years , described as for example thinking about whether there is meaning to life, but denies having any suicidal plan or intention and describes protective  factors such as spouse/stepchildren  She reports she has been on several psychiatric medications in the past . Remembers having been on Wellbutrin , which she feels caused her to feel worse , Lexapro, which did not work, Prozac, which she does not remember as working . She was also on Trileptal and on Abilify in the past . Of note she does remember Abilify, apparently prescribed as augmentation along with an antidepressant as effective and helpful and states she felt better on it .  Of note, denies any clear history of hypomania or mania, and does not endorse any clear history of bipolarity    Associated Signs/Symptoms: Depression Symptoms: She describes some chronic neurovegetative symptoms, mainly subjective sense of anhedonia, subjective sense of low energy level. (Hypo) Manic Symptoms:  denies and none are apparent  Anxiety Symptoms:  describes chronic anxiety symptoms, mainly a tendency to worry excessively  Psychotic Symptoms:   none  PTSD Symptoms: Denies   Past Psychiatric History: Psychiatric admission while serving in the [employer] ( 2017) . At the time admitted for depression and two suicidal attempts within a few days of each other, by cutting wrists, and then by cutting on neck and overdosing . No admissions or suicide attempts since then . She remembers these suicidal attempts as impulsive , not planned  Denies history of psychosis, denies history of mania or hypomania. Reports history of self cutting as a teenager . Does not endorse history of severe trauma , no combat experiences in Military service, no PTSD symptoms endorsed .  She reports she has been on several psychiatric medications in the past . Remembers having been on  Wellbutrin , which she feels caused her to feel worse , Lexapro, which did not work, Prozac, which she does not remember as working . She was also on Trileptal and on Abilify in the past . Of note she does remember Abilify, apparently prescribed as augmentation  along with an antidepressant as effective and helpful and states she felt better on it .   Previous Psychotropic Medications: Yes - see above  Substance Abuse History in the last 12 months:  No. Denies alcohol or drug abuse  Consequences of Substance Abuse: NA  Past Medical History: Notes she takes Pepcid daily for GERD symptoms, has been taking for more than a year . Allergic to PCN. Does not smoke . Denies being pregnant .  Past Medical History:  Diagnosis Date   Hypertension    No past surgical history on file.  Family Psychiatric History: reports close to mother, estranged from father. No clear history of mental illness in family   Family History:  Family History  Problem Relation Age of Onset   Hypertension Mother    Healthy Sister    Hypertension Maternal Grandmother    Diabetes Maternal Grandmother     Social History:  She is married, has two stepchildren ages 10/8, no biological children, works full time as Charity fundraiser . Reports stable family environment . States enjoys her job. Served in the [employer] as a Charity fundraiser. Honorable discharge/ no combat experience reported . Social History   Socioeconomic History   Marital status: Married    Spouse name: Not on file   Number of children: Not on file   Years of education: Not on file   Highest education level: Not on file  Occupational History   Not on file  Tobacco Use   Smoking status: Never   Smokeless tobacco: Never  Vaping Use   Vaping Use: Never used  Substance and Sexual Activity   Alcohol use: Yes   Drug use: Not Currently   Sexual activity: Yes    Birth control/protection: None  Other Topics Concern   Not on file  Social History Narrative   Not on file   Social Determinants of Health   Financial Resource Strain: Not on file  Food Insecurity: Not on file  Transportation Needs: Not on file  Physical Activity: Not on file  Stress: Not on file  Social Connections: Not on file    Additional Social History: NA    Allergies:   Allergies  Allergen Reactions   Penicillins Rash    Has patient had a PCN reaction causing immediate rash, facial/tongue/throat swelling, SOB or lightheadedness with hypotension: No Has patient had a PCN reaction causing severe rash involving mucus membranes or skin necrosis: No Has patient had a PCN reaction that required hospitalization: No Has patient had a PCN reaction occurring within the last 10 years: No If all of the above answers are "NO", then may proceed with Cephalosporin use.    Metabolic Disorder Labs: No results found for: HGBA1C, MPG No results found for: PROLACTIN No results found for: CHOL, TRIG, HDL, CHOLHDL, VLDL, LDLCALC No results found for: TSH  Therapeutic Level Labs: No results found for: LITHIUM No results found for: CBMZ No results found for: VALPROATE  Current Medications: Current Outpatient Medications  Medication Sig Dispense Refill   cetirizine (ZYRTEC) 10 MG tablet Take by mouth.     Famotidine (PEPCID PO) Take by mouth.     No current facility-administered medications for this visit.    Musculoskeletal: Strength &  Muscle Tone: within normal limits Gait & Station: normal Patient leans: N/A  Psychiatric Specialty Exam: Review of Systems reports some chronic lower back pain  There were no vitals taken for this visit.There is no height or weight on file to calculate BMI.  General Appearance: Casual  Eye Contact:  Good  Speech:  Normal Rate  Volume:  Normal  Mood:   reports chronic depression , which she states has been present for years   Affect:   reactive, smiles at times appropriately during session  Thought Process:  Coherent and Linear  Orientation:  Full (Time, Place, and Person)  Thought Content:   no hallucinations, no delusions   Suicidal Thoughts:   describes chronic ( years ) intermittent passive SI. Denies current suicidal  thoughts /plans or intention .  Homicidal Thoughts:  No  Memory:   recent and remote  grossly intact   Judgement:  Good  Insight:  Present  Psychomotor Activity:  Normal- no restlessness, no agitation   Concentration:  Concentration: Good and Attention Span: Good  Recall:  Good  Fund of Knowledge:Good  Language: Good  Akathisia:  No  Handed:  Right  AIMS (if indicated):  not done  Assets:  Communication Skills Desire for Improvement Housing Intimacy Social Support Talents/Skills  ADL's:  Intact  Cognition: WNL  Sleep:   reports as variable    Screenings: Oceanographer Row Office Visit from 01/25/2021 in [facility]  PHQ-2 Total Score 2  PHQ-9 Total Score 5      Flowsheet Row Office Visit from 01/25/2021 in [facility] ED from 09/28/2020 in [facility]  C-SSRS RISK CATEGORY Error: Q3, 4, or 5 should not be populated when Q2 is No No Risk       Assessment and Plan:   35 year old married female, employed, has 2 stepchildren. She presents to establish outpatient psychiatric care and expressing interest in starting psychiatric medication management. She reports a history of depression which she describes as chronic.  She states she has been depressed for years with a waxing and waning course.  She endorses some chronic neurovegetative symptoms particularly subjective sense of anhedonia but functions well in her daily activities to include full-time work and family relationships.  She has a history of a prior psychiatric admission for suicide attempt in 2017 at which time she was in the [employer].  She does not endorse any history of psychosis.  She does not endorse any clear history of hypomania or mania.  She has had several antidepressant trials in the past.  Most recently she had been prescribed Lexapro which she stopped 2 months ago as she felt it was not working.  She has not been on any psychiatric medication for several months now.  We discussed treatment options reviewed several  different antidepressant options. She agrees to Cymbalta which she has not been on before.  Cymbalta may have the added benefit of helping with management of lower back pain.  Also, she reports that Abilify was helpful in the past and remembers this particular medication is helpful.  We will start as augmentation strategy at 5 mg daily and titrate if needed.  Side effects were reviewed.  Also ordered a TSH and a vitamin B12 serum level, the latter particularly as she reports she has been taking famotidine for a period of several months at least.  I have encouraged her to review this medication  with her PCP  We reviewed  safety plan.  She reports her spouse is supportive.  She also has a good relationship with her mother.  Feels she trusts them and can speak with them freely about her feelings and describes and has good support systems.  She also agrees to go to the ED if any emerging SI.  Of note she also identifies her stepchildren as a protective factor.  In addition she just recently started individual psychotherapy-has seen therapist once and has second appointment later this week, and generally presents invested in treatment at this time.  Dx- MDD, no psychotic features        Consider GAD   Plan - Start Cymbalta 30 mgr QDAY for depression, side effects reviewed . # 15, 1 refill                  Start Abilify 5 mgr QDAY as augmentation , side effects reviewed . #15, 1 refill             Check B 12 , TSH            Next appointment in 2 weeks, agrees to contact clinic sooner if any worsening prior             I provided her with outpatient clinic office number and also with  hotline /crisis number    Craige Cotta, MD 11/1/20223:34 PM

## 2021-03-16 ENCOUNTER — Ambulatory Visit (INDEPENDENT_AMBULATORY_CARE_PROVIDER_SITE_OTHER): Payer: 59 | Admitting: Psychology

## 2021-03-16 DIAGNOSIS — F331 Major depressive disorder, recurrent, moderate: Secondary | ICD-10-CM | POA: Diagnosis not present

## 2021-03-16 LAB — VITAMIN B12: Vitamin B-12: 958 pg/mL (ref 232–1245)

## 2021-03-16 LAB — TSH: TSH: 2.37 u[IU]/mL (ref 0.450–4.500)

## 2021-03-16 LAB — FOLATE: Folate: 11.1 ng/mL (ref >3.0)

## 2021-03-24 ENCOUNTER — Ambulatory Visit (INDEPENDENT_AMBULATORY_CARE_PROVIDER_SITE_OTHER): Payer: 59 | Admitting: Psychology

## 2021-03-24 DIAGNOSIS — F331 Major depressive disorder, recurrent, moderate: Secondary | ICD-10-CM | POA: Diagnosis not present

## 2021-03-28 ENCOUNTER — Other Ambulatory Visit: Payer: Self-pay

## 2021-03-28 ENCOUNTER — Telehealth (HOSPITAL_BASED_OUTPATIENT_CLINIC_OR_DEPARTMENT_OTHER): Payer: 59 | Admitting: Psychiatry

## 2021-03-28 ENCOUNTER — Encounter (HOSPITAL_COMMUNITY): Payer: Self-pay | Admitting: Psychiatry

## 2021-03-28 DIAGNOSIS — F324 Major depressive disorder, single episode, in partial remission: Secondary | ICD-10-CM

## 2021-03-28 DIAGNOSIS — F329 Major depressive disorder, single episode, unspecified: Secondary | ICD-10-CM

## 2021-03-28 HISTORY — DX: Major depressive disorder, single episode, unspecified: F32.9

## 2021-03-28 MED ORDER — ARIPIPRAZOLE 5 MG PO TABS: 5.0000 mg | ORAL_TABLET | Freq: Every day | ORAL | 1 refills | Status: DC

## 2021-03-28 MED ORDER — DULOXETINE HCL 60 MG PO CPEP: 60.0000 mg | ORAL_CAPSULE | Freq: Every day | ORAL | 1 refills | Status: DC

## 2021-03-28 NOTE — Progress Notes (Signed)
BH MD/PA/NP OP Progress Note  03/28/2021 5:38 PM [name]  MRN:  [id number]  Chief Complaint: Returns for medication management  This was a phone based appointment.  Limitations associated with this type of medication have been reviewed.  Patient's identity has been verified using 2 different identifiers. 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  She described a history of chronic depression which waxes and wanes over time but is characterized as persistent/chronic.  She also described a history of anxiety, to include a tendency to worry excessively even about relatively minor issues and some panic attacks in the past.  She endorsed a history of 1 prior psychiatric admission in 2017 (at the time was in the [employer]) for depression and self cutting.  She was started on Cymbalta and Abilify.  She reports she has tolerated these medications well.  She did experience some nausea initially, possibly from Cymbalta but states that this symptom has improved.  Denies vomiting.  Also , denies akathisia or motor restlessness. She states she is feeling "a little better" and expresses optimism about current medication regimen.  She does endorse some persistent depression/anhedonia.  She denies suicidal ideations at this time and is future oriented.  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 She reports she continues to function well in her daily activities which include work and home/family life. She also recently started individual psychotherapy which she plans to continue.  11/1 labs-folate 11.1, vitamin B12 958, TSH 2.37  Visit Diagnosis: MDD, No psychotic features, GAD by history  Past Psychiatric History: History of depression and anxiety.  Describes as chronic.  History of 1 prior  psychiatric admission in 2017 for suicidal ideations and self cutting.  History of prior psychiatric medication trials, most recently Lexapro which she felt was not working.  No history of psychosis.  No history of mania or hypomania.  Past Medical History:  Past Medical History:  Diagnosis Date   Hypertension    No past surgical history on file.  Family Psychiatric History:   Family History:  Family History  Problem Relation Age of Onset   Hypertension Mother    Healthy Sister    Hypertension Maternal Grandmother    Diabetes Maternal Grandmother     Social History:  Social History   Socioeconomic History   Marital status: Married    Spouse name: Not on file   Number of children: Not on file   Years of education: Not on file   Highest education level: Not on file  Occupational History   Not on file  Tobacco Use   Smoking status: Never   Smokeless tobacco: Never  Vaping Use   Vaping Use: Never used  Substance and Sexual Activity   Alcohol use: Yes   Drug use: Not Currently   Sexual activity: Yes    Birth control/protection: None  Other Topics Concern   Not on file  Social History Narrative   Not on file   Social Determinants of Health   Financial Resource Strain: Not on file  Food Insecurity: Not on file  Transportation Needs: Not on file  Physical Activity: Not on file  Stress: Not on file  Social Connections: Not on file    Allergies:  Allergies  Allergen Reactions   Penicillins Rash    Has patient had a PCN reaction causing  immediate rash, facial/tongue/throat swelling, SOB or lightheadedness with hypotension: No Has patient had a PCN reaction causing severe rash involving mucus membranes or skin necrosis: No Has patient had a PCN reaction that required hospitalization: No Has patient had a PCN reaction occurring within the last 10 years: No If all of the above answers are "NO", then may proceed with Cephalosporin use.    Metabolic Disorder  Labs: No results found for: HGBA1C, MPG No results found for: PROLACTIN No results found for: CHOL, TRIG, HDL, CHOLHDL, VLDL, LDLCALC Lab Results  Component Value Date   TSH 2.370 03/15/2021    Therapeutic Level Labs: No results found for: LITHIUM No results found for: VALPROATE No components found for:  CBMZ  Current Medications: Current Outpatient Medications  Medication Sig Dispense Refill   ARIPiprazole (ABILIFY) 5 MG tablet Take 1 tablet (5 mg total) by mouth daily. 15 tablet 1   cetirizine (ZYRTEC) 10 MG tablet Take by mouth.     DULoxetine (CYMBALTA) 30 MG capsule Take 1 capsule (30 mg total) by mouth daily. 15 capsule 1   Famotidine (PEPCID PO) Take by mouth.     No current facility-administered medications for this visit.     Musculoskeletal: Strength & Muscle Tone:  N/A Gait & Station:  N/A Patient leans: N/A  Psychiatric Specialty Exam: Please note limitations in obtaining a full exam in the context of phone communication Review of Systems she reports nausea which has now improved.  Does not endorse vomiting  Last menstrual period 03/08/2021.There is no height or weight on file to calculate BMI.  General Appearance: NA  Eye Contact:  NA  Speech:  Normal Rate  Volume:  Normal  Mood:  reports mood as " a little better" but continues to endorse some depression  Affect:  Appropriate, reactive   Thought Process:  Linear  Orientation:  Full (Time, Place, and Person)  Thought Content:  no hallucinations, no delusions     Suicidal Thoughts:  No denies suicidal or self injurious ideations and presents future oriented   Homicidal Thoughts:  No  Memory:   Recent and remote grossly intact  Judgement:  Good  Insight:  Good  Psychomotor Activity:  NA  Concentration:  Concentration: Good and Attention Span: Good  Recall:  Good  Fund of Knowledge: Good  Language: Good  Akathisia:  Negative- does not report  Handed:  Right  AIMS (if indicated): not done  Assets:   Communication Skills Desire for Improvement Housing Resilience Social Support Talents/Skills  ADL's:  NA  Cognition: WNL  Sleep:   reports sleeping well    Screenings: PHQ2-9    Flowsheet Row Office Visit from 01/25/2021 in [facility]  PHQ-2 Total Score 2  PHQ-9 Total Score 5      Flowsheet Row Office Visit from 01/25/2021 in [facility] ED from 09/28/2020 in [facility]  C-SSRS RISK CATEGORY Error: Q3, 4, or 5 should not be populated when Q2 is No No Risk        Assessment and Plan:   35 year old female with a history of chronic depression and anxiety/GAD.  She was recently started on Cymbalta/Abilify.  She developed some nausea probably from Cymbalta but she states this has improved and denies vomiting.  Overall tolerated medication combination well . She states she is feeling somewhat better, and affect presents reactive. No SI and presents future oriented . Does continue to endorse some sadness /vague sense of anhedonia.  No  psychotic symptoms are reported are noted and she denies any prior history of psychosis. We reviewed options - interested in /agrees to Cymbalta titration ( currently at 30 mgr QDAY ) . Side effects reviewed to include potential for exacerbation of nausea , which as above, she reports has subsided .   Dx - MDD , no psychotic features, consider GAD  Increase Cymbalta to 60 mgr QDAY - for depression, anxiety, may also help back pain Continue Abilify 5 mgr QDAY as augmentation  Continue individual psychotherapy  Next appointment in about 4 weeks, she agrees to contact clinic prior if any worsening or medication concern prior .      Craige Cotta, MD 03/28/2021, 5:39 PM

## 2021-03-31 ENCOUNTER — Ambulatory Visit (INDEPENDENT_AMBULATORY_CARE_PROVIDER_SITE_OTHER): Payer: 59 | Admitting: Psychology

## 2021-03-31 DIAGNOSIS — F331 Major depressive disorder, recurrent, moderate: Secondary | ICD-10-CM | POA: Diagnosis not present

## 2021-04-12 DIAGNOSIS — J3089 Other allergic rhinitis: Secondary | ICD-10-CM | POA: Diagnosis not present

## 2021-04-12 DIAGNOSIS — F458 Other somatoform disorders: Secondary | ICD-10-CM | POA: Diagnosis not present

## 2021-04-12 DIAGNOSIS — F32A Depression, unspecified: Secondary | ICD-10-CM | POA: Diagnosis not present

## 2021-04-12 DIAGNOSIS — G4710 Hypersomnia, unspecified: Secondary | ICD-10-CM | POA: Diagnosis not present

## 2021-04-12 DIAGNOSIS — E669 Obesity, unspecified: Secondary | ICD-10-CM | POA: Diagnosis not present

## 2021-04-12 DIAGNOSIS — F419 Anxiety disorder, unspecified: Secondary | ICD-10-CM | POA: Diagnosis not present

## 2021-04-12 DIAGNOSIS — G4700 Insomnia, unspecified: Secondary | ICD-10-CM | POA: Diagnosis not present

## 2021-04-12 DIAGNOSIS — G4730 Sleep apnea, unspecified: Secondary | ICD-10-CM | POA: Diagnosis not present

## 2021-04-13 ENCOUNTER — Ambulatory Visit (INDEPENDENT_AMBULATORY_CARE_PROVIDER_SITE_OTHER): Payer: 59 | Admitting: Psychology

## 2021-04-13 DIAGNOSIS — F331 Major depressive disorder, recurrent, moderate: Secondary | ICD-10-CM | POA: Diagnosis not present

## 2021-04-15 DIAGNOSIS — G4733 Obstructive sleep apnea (adult) (pediatric): Secondary | ICD-10-CM | POA: Diagnosis not present

## 2021-04-16 DIAGNOSIS — G4733 Obstructive sleep apnea (adult) (pediatric): Secondary | ICD-10-CM | POA: Diagnosis not present

## 2021-04-26 ENCOUNTER — Telehealth (HOSPITAL_COMMUNITY): Payer: 59 | Admitting: Psychiatry

## 2021-04-26 ENCOUNTER — Encounter (HOSPITAL_COMMUNITY): Payer: Self-pay

## 2021-04-26 ENCOUNTER — Other Ambulatory Visit: Payer: Self-pay

## 2021-04-26 NOTE — Progress Notes (Signed)
BH MD/PA/NP OP Progress Note  04/26/2021 9:46 AM [name]  MRN:  [id number]  Attempted to reach patient for scheduled phone/video based follow up appointment . No answer. Left message asking her to call our office and will ask front desk staff to contact her in order to reschedule .     Craige Cotta, MD 04/26/2021, 9:46 AM  Patient ID: [name], female   DOB: [birth year], 35 y.o.   MRN: [id number]

## 2021-04-27 DIAGNOSIS — G4700 Insomnia, unspecified: Secondary | ICD-10-CM | POA: Diagnosis not present

## 2021-04-27 DIAGNOSIS — J3089 Other allergic rhinitis: Secondary | ICD-10-CM | POA: Diagnosis not present

## 2021-04-27 DIAGNOSIS — E669 Obesity, unspecified: Secondary | ICD-10-CM | POA: Diagnosis not present

## 2021-04-27 DIAGNOSIS — F419 Anxiety disorder, unspecified: Secondary | ICD-10-CM | POA: Diagnosis not present

## 2021-04-27 DIAGNOSIS — G4710 Hypersomnia, unspecified: Secondary | ICD-10-CM | POA: Diagnosis not present

## 2021-04-27 DIAGNOSIS — F458 Other somatoform disorders: Secondary | ICD-10-CM | POA: Diagnosis not present

## 2021-04-27 DIAGNOSIS — G4733 Obstructive sleep apnea (adult) (pediatric): Secondary | ICD-10-CM | POA: Diagnosis not present

## 2021-04-27 DIAGNOSIS — F32A Depression, unspecified: Secondary | ICD-10-CM | POA: Diagnosis not present

## 2021-05-07 DIAGNOSIS — J019 Acute sinusitis, unspecified: Secondary | ICD-10-CM | POA: Diagnosis not present

## 2021-05-23 ENCOUNTER — Other Ambulatory Visit: Payer: Self-pay

## 2021-05-23 ENCOUNTER — Encounter (HOSPITAL_COMMUNITY): Payer: Self-pay | Admitting: Psychiatry

## 2021-05-23 ENCOUNTER — Telehealth (HOSPITAL_BASED_OUTPATIENT_CLINIC_OR_DEPARTMENT_OTHER): Payer: 59 | Admitting: Psychiatry

## 2021-05-23 DIAGNOSIS — F324 Major depressive disorder, single episode, in partial remission: Secondary | ICD-10-CM | POA: Diagnosis not present

## 2021-05-23 MED ORDER — ARIPIPRAZOLE 5 MG PO TABS: 5.0000 mg | ORAL_TABLET | Freq: Every day | ORAL | 1 refills | Status: DC

## 2021-05-23 MED ORDER — DULOXETINE HCL 60 MG PO CPEP: 60.0000 mg | ORAL_CAPSULE | Freq: Every day | ORAL | 1 refills | Status: DC

## 2021-05-23 NOTE — Progress Notes (Signed)
BH MD/PA/NP OP Progress Note  05/23/2021 8:10 AM [name]  MRN:  [id number]  Chief Complaint: Returns for medication management  This was a phone based appointment.  Limitations associated with this type of medication have been reviewed.  Patient's identity has been verified using 2 different identifiers. Location of parties Patient-Home [facility] outpatient clinic Duration-66minutes  HPI: 36 year old female, married, employed as Charity fundraiser, initial visit for depression and anxiety.  She described a history of chronic depression which waxes and wanes over time but is characterized as persistent/chronic.  She also described a history of anxiety, to include a tendency to worry excessively even about relatively minor issues and some panic attacks in the past.  She endorsed a history of 1 prior psychiatric admission in 2017 (at the time was in the [employer]) for depression and self cutting.  [name] reports she has been doing " all right". She describes both anxiety and mood have improved .  She describes she has been less anxious and has had not recent panic attacks. Currently does not endorse significant neuro-vegetative symptoms. Describes sleep and appetite have been "OK". Denies anhedonia. 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 Reports work is going well .  She states she has been taking prescribed medications . states she had initially experienced some nausea with medications but this has improved . No vomiting/ no diarrhea. No other side effects reported . No akathisia reported . She reports she continues to see her therapist regularly, has cut back to twice a month. Reports psychotherapy is helpful.     Visit Diagnosis: MDD, No psychotic features, GAD by history  Past Psychiatric History: History of depression and anxiety.   Describes as chronic.  History of 1 prior psychiatric admission in 2017 for suicidal ideations and self cutting.  History of prior psychiatric medication trials, most recently Lexapro which she felt was not working.  No history of psychosis.  No history of mania or hypomania.  Past Medical History:  Past Medical History:  Diagnosis Date   Hypertension    MDD (major depressive disorder) 03/28/2021   No past surgical history on file.  Family Psychiatric History:   Family History:  Family History  Problem Relation Age of Onset   Hypertension Mother    Healthy Sister    Hypertension Maternal Grandmother    Diabetes Maternal Grandmother     Social History:  Social History   Socioeconomic History   Marital status: Married    Spouse name: Not on file   Number of children: Not on file   Years of education: Not on file   Highest education level: Not on file  Occupational History   Not on file  Tobacco Use   Smoking status: Never   Smokeless tobacco: Never  Vaping Use   Vaping Use: Never used  Substance and Sexual Activity   Alcohol use: Yes   Drug use: Not Currently   Sexual activity: Yes    Birth control/protection: None  Other Topics Concern   Not on file  Social History Narrative   Not on file   Social Determinants of Health   Financial Resource Strain: Not on file  Food Insecurity: Not on file  Transportation Needs: Not on file  Physical Activity: Not on file  Stress: Not on file  Social Connections: Not on file    Allergies:  Allergies  Allergen Reactions   Penicillins Rash    Has patient had a PCN reaction causing immediate rash, facial/tongue/throat swelling, SOB or lightheadedness with hypotension: No Has patient had a PCN reaction causing severe rash involving mucus membranes or skin necrosis: No Has patient had a PCN reaction that required hospitalization: No Has patient had a PCN reaction occurring within the last 10 years: No If all of the above  answers are "NO", then may proceed with Cephalosporin use.    Metabolic Disorder Labs: No results found for: HGBA1C, MPG No results found for: PROLACTIN No results found for: CHOL, TRIG, HDL, CHOLHDL, VLDL, LDLCALC Lab Results  Component Value Date   TSH 2.370 03/15/2021    Therapeutic Level Labs: No results found for: LITHIUM No results found for: VALPROATE No components found for:  CBMZ  Current Medications: Current Outpatient Medications  Medication Sig Dispense Refill   ARIPiprazole (ABILIFY) 5 MG tablet Take 1 tablet (5 mg total) by mouth daily. 30 tablet 1   cetirizine (ZYRTEC) 10 MG tablet Take by mouth.     DULoxetine (CYMBALTA) 60 MG capsule Take 1 capsule (60 mg total) by mouth daily. 30 capsule 1   Famotidine (PEPCID PO) Take by mouth.     No current facility-administered medications for this visit.     Musculoskeletal: Strength & Muscle Tone:  N/A Gait & Station:  N/A Patient leans: N/A  Psychiatric Specialty Exam: Please note limitations in obtaining a full exam in the context of phone communication Review of Systems she reports nausea which has now improved.  Does not endorse vomiting  There were no vitals taken for this visit.There is no height or weight on file to calculate BMI.  General Appearance: NA  Eye Contact:  NA  Speech:  Normal Rate  Volume:  Normal  Mood:  reports mood as " better" and improved , appears euthymic   Affect:  presents appropriate and full in range  Thought Process:  Linear  Orientation:  Full (Time, Place, and Person)  Thought Content:  no hallucinations, no delusions     Suicidal Thoughts:  No denies suicidal or self injurious ideations   Homicidal Thoughts:  No  Memory:   Recent and remote grossly intact  Judgement:  Good  Insight:  Good  Psychomotor Activity:  NA  Concentration:  Concentration: Good and Attention Span: Good  Recall:  Good  Fund of Knowledge: Good  Language: Good  Akathisia:  Negative- does not report   Handed:  Right  AIMS (if indicated): not done  Assets:  Communication Skills Desire for Improvement Housing Resilience Social Support Talents/Skills  ADL's:  NA  Cognition: WNL  Sleep:   reports sleeping well    Screenings: PHQ2-9    Flowsheet Row Office Visit from 01/25/2021 in [facility]  PHQ-2 Total Score 2  PHQ-9 Total Score 5      Flowsheet Row Office Visit from 01/25/2021 in [facility] ED from 09/28/2020 in [facility]PART[facility]  C-SSRS RISK CATEGORY Error: Q3, 4, or 5 should not be populated when Q2 is No No Risk        Assessment and Plan:   36 year old female with a history of chronic depression and anxiety/GAD.    Reports she is doing well and states  both anxiety and mood have improved .  She reports her family and work life as stable and states has been  doing well . 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 Reports work is going well .  She describes she has been less anxious and has had not recent panic attacks. Does not endorse significant neuro-vegetative symptoms or anhedonia. No SI Reports anxiety symptoms have improved , with less worry and no recent panic attacks Had initially developed nausea , probably from Cymbalta, but states this has subsided- at this time denies vomiting and reports appetite has been stable    Dx - MDD , no psychotic features, consider GAD  Continue Cymbalta t60 mgr QDAY - for depression, anxiety, may also help back pain Continue Abilify 5 mgr QDAY as antidepressant augmentation  Continue individual psychotherapy  Next appointment in about 6-8  weeks, she agrees to contact clinic prior if any worsening or medication concern prior . We have confirmed she has numbers for clinic and for crisis hotline if needed       Craige Cotta, MD 05/23/2021, 8:10 AM  Patient ID: [name], female   DOB: 19-Nov-1985, 36 y.o.    MRN: [id number]

## 2021-07-14 DIAGNOSIS — Z0289 Encounter for other administrative examinations: Secondary | ICD-10-CM

## 2021-07-22 ENCOUNTER — Other Ambulatory Visit: Payer: Self-pay

## 2021-07-22 ENCOUNTER — Ambulatory Visit (INDEPENDENT_AMBULATORY_CARE_PROVIDER_SITE_OTHER): Payer: 59 | Admitting: Family Medicine

## 2021-07-22 ENCOUNTER — Encounter (INDEPENDENT_AMBULATORY_CARE_PROVIDER_SITE_OTHER): Payer: Self-pay | Admitting: Family Medicine

## 2021-07-22 VITALS — BP 132/90 | HR 90 | Temp 98.2°F | Ht 62.0 in | Wt 240.0 lb

## 2021-07-22 DIAGNOSIS — Z1331 Encounter for screening for depression: Secondary | ICD-10-CM

## 2021-07-22 DIAGNOSIS — Z6841 Body Mass Index (BMI) 40.0 and over, adult: Secondary | ICD-10-CM

## 2021-07-22 DIAGNOSIS — R030 Elevated blood-pressure reading, without diagnosis of hypertension: Secondary | ICD-10-CM

## 2021-07-22 DIAGNOSIS — E668 Other obesity: Secondary | ICD-10-CM

## 2021-07-22 DIAGNOSIS — G4733 Obstructive sleep apnea (adult) (pediatric): Secondary | ICD-10-CM

## 2021-07-22 DIAGNOSIS — R5383 Other fatigue: Secondary | ICD-10-CM | POA: Diagnosis not present

## 2021-07-22 DIAGNOSIS — Z9189 Other specified personal risk factors, not elsewhere classified: Secondary | ICD-10-CM

## 2021-07-22 NOTE — Progress Notes (Signed)
? ? ? ? ?Chief Complaint:  ? ?OBESITY ?[name] (MR# [id number]) is a 36 y.o. female who presents for evaluation and treatment of obesity and related comorbidities. Current BMI is Body mass index is 43.9 kg/m?[name]. [name] has been struggling with her weight for many years and has been unsuccessful in either losing weight, maintaining weight loss, or reaching her healthy weight goal. ? 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  ? ?[name] is currently in the action stage of change and ready to dedicate time achieving and maintaining a healthier weight. [name] is interested in becoming our patient and working on intensive lifestyle modifications including (but not limited to) diet and exercise for weight loss. ? ?[name] were reviewed today and are as follows: she thinks her family will eat healthier with her, her desired weight loss is 90-100 lbs, she has been heavy most of her life, she started gaining weight when she started the night shift 13 years ago, her heaviest weight ever was 242 pounds, she has significant food cravings issues, she wakes up frequently in the middle of the night to eat, she skips meals frequently, she is frequently drinking liquids with calories, she frequently makes poor food choices, and she frequently eats larger portions than normal. ? ?Depression Screen ?[name]'s Food and Mood (modified PHQ-9) score was 16. ? ?Depression screen [facility] 2/9 07/22/2021  ?Decreased Interest 3  ?Down, Depressed, Hopeless 3  ?PHQ - 2 Score 6  ?Altered sleeping 3  ?Tired, decreased energy 3  ?Change in appetite 1  ?Feeling bad or failure about yourself  2  ?Trouble concentrating 0  ?Moving slowly or fidgety/restless 0  ?Suicidal thoughts 1  ?PHQ-9 Score 16  ?Difficult doing work/chores Very difficult  ? ?Subjective:  ? ?1. Other fatigue ?[name] admits to daytime somnolence and admits to waking up still tired. Patient has a history of symptoms of daytime fatigue  and morning fatigue. [name] generally gets 6 or 7 hours of sleep per night, and states that she has nightime awakenings. Snoring is present. Apneic episodes are present. Epworth Sleepiness Score is 9.  ? ?2. OSA (obstructive sleep apnea) ?[name] has a history of obstructive sleep apnea. She is working on weight loss. ? ?3. Elevated BP without diagnosis of hypertension ?[name]'s diastolic blood pressure is elevated today. Hr blood pressure has traditionally not been a problem for her. She denies chest pain or headache. ? ?4. At risk for heart disease ?[name] is at higher than average risk for cardiovascular disease due to obesity. ? ?Assessment/Plan:  ? ?1. Other fatigue ?[name] does feel that her weight is causing her energy to be lower than it should be. Fatigue may be related to obesity, depression or many other causes. Labs will be ordered, and in the meanwhile, [name] will focus on self care including making healthy food choices, increasing physical activity and focusing on stress reduction. ? ?- EKG 12-Lead ?- Vitamin B12 ?- CBC with Differential/Platelet ?- Comprehensive metabolic panel ?- Insulin, random ?- Hemoglobin A1c ?- Lipid Panel With LDL/HDL Ratio ?- T3 ?- T4, free ?- TSH ?- VITAMIN D 25 Hydroxy (Vit-D Deficiency, Fractures) ?- EKG 12-Lead ? ?2. OSA (obstructive sleep apnea) ?Intensive lifestyle modifications are the first line treatment for this issue. We discussed several lifestyle modifications today. IC was done today. [name] is to start her diet and weight loss, and we will continue to follow. Orders and follow up as documented in patient  record.  ? ?3. Elevated BP without diagnosis of hypertension ?We will check labs today. [name] will start her Category 3 plan. We will watch for signs of hypotension as she continues her lifestyle modifications. ? ?4. Depression screening ?Corda had a positive depression screening. Depression is commonly associated with obesity and often results in emotional eating  behaviors. We will monitor this closely and work on CBT to help improve the non-hunger eating patterns. Referral to Psychology may be required if no improvement is seen as she continues in our clinic. ? ?5. At risk for heart disease ?[name] was given approximately 30 minutes of coronary artery disease prevention counseling today. She is 36 y.o. female and has risk factors for heart disease including obesity. We discussed intensive lifestyle modifications today with an emphasis on specific weight loss instructions and strategies. ? ?Repetitive spaced learning was employed today to elicit superior memory formation and behavioral change.  ? ?6. Class 3 severe obesity with serious comorbidity and body mass index (BMI) of 40.0 to 44.9 in adult, unspecified obesity type (HCC) ?[name] is currently in the action stage of change and her goal is to continue with weight loss efforts. I recommend [name] begin the structured treatment plan as follows: ? ?She has agreed to the Category 3 Plan. ? ?Exercise goals: No exercise has been prescribed for now, while we concentrate on nutritional changes. ? ?Behavioral modification strategies: increasing lean protein intake and no skipping meals. ? ?She was informed of the importance of frequent follow-up visits to maximize her success with intensive lifestyle modifications for her multiple health conditions. She was informed we would discuss her lab results at her next visit unless there is a critical issue that needs to be addressed sooner. [name] agreed to keep her next visit at the agreed upon time to discuss these results. ? ?Objective:  ? ?Blood pressure 132/90, pulse 90, temperature 98.2 ?F (36.8 ?C), height 5\' 2"  (1.575 m), weight 240 lb (108.9 kg), last menstrual period 06/25/2021, SpO2 100 %. Body mass index is 43.9 kg/m?. ? ?EKG: Normal sinus rhythm, rate 87 BPM. ? ?Indirect Calorimeter completed today shows a VO2 of 284 and a REE of 1958.  Her calculated basal metabolic rate is  1860 thus her basal metabolic rate is better than expected. ? ?General: Cooperative, alert, well developed, in no acute distress. ?HEENT: Conjunctivae and lids unremarkable. ?Cardiovascular: Regular rhythm.  ?Lungs: Normal work of breathing. ?Neurologic: No focal deficits.  ? ?Lab Results  ?Component Value Date  ? CREATININE 0.69 09/28/2020  ? BUN 14 09/28/2020  ? NA 136 09/28/2020  ? K 4.1 09/28/2020  ? CL 101 09/28/2020  ? CO2 24 09/28/2020  ? ?Lab Results  ?Component Value Date  ? ALT 19 09/28/2020  ? AST 20 09/28/2020  ? ALKPHOS 46 09/28/2020  ? BILITOT 0.6 09/28/2020  ? ?No results found for: HGBA1C ?No results found for: INSULIN ?Lab Results  ?Component Value Date  ? TSH 2.370 03/15/2021  ? ?No results found for: CHOL, HDL, LDLCALC, LDLDIRECT, TRIG, CHOLHDL ?Lab Results  ?Component Value Date  ? WBC 8.9 09/28/2020  ? HGB 15.2 (H) 09/28/2020  ? HCT 45.1 09/28/2020  ? MCV 94.9 09/28/2020  ? PLT 159 09/28/2020  ? ?No results found for: IRON, TIBC, FERRITIN ? ?Attestation Statements:  ? ?Reviewed by clinician on day of visit: allergies, medications, problem list, medical history, surgical history, family history, social history, and previous encounter notes. ? ? ?I, 09/30/2020, am acting as Burt Knack for Energy manager  Dalbert Garnet, MD. ? ?I have reviewed the above documentation for accuracy and completeness, and I agree with the above. - Quillian Quince, MD ? ? ?

## 2021-07-26 LAB — T4, FREE: Free T4: 1.13 ng/dL (ref 0.82–1.77)

## 2021-07-26 LAB — T3: T3, Total: 162 ng/dL (ref 71–180)

## 2021-07-26 LAB — CBC WITH DIFFERENTIAL/PLATELET
Basophils Absolute: 0 10*3/uL (ref 0.0–0.2)
Basos: 0 %
EOS (ABSOLUTE): 0.8 10*3/uL — ABNORMAL HIGH (ref 0.0–0.4)
Eos: 10 %
Hematocrit: 39.1 % (ref 34.0–46.6)
Hemoglobin: 13.4 g/dL (ref 11.1–15.9)
Immature Grans (Abs): 0 10*3/uL (ref 0.0–0.1)
Immature Granulocytes: 0 %
Lymphocytes Absolute: 1.7 10*3/uL (ref 0.7–3.1)
Lymphs: 22 %
MCH: 31.4 pg (ref 26.6–33.0)
MCHC: 34.3 g/dL (ref 31.5–35.7)
MCV: 92 fL (ref 79–97)
Monocytes Absolute: 0.4 10*3/uL (ref 0.1–0.9)
Monocytes: 6 %
Neutrophils Absolute: 4.9 10*3/uL (ref 1.4–7.0)
Neutrophils: 62 %
Platelets: 150 10*3/uL (ref 150–450)
RBC: 4.27 x10E6/uL (ref 3.77–5.28)
RDW: 13.1 % (ref 11.7–15.4)
WBC: 7.9 10*3/uL (ref 3.4–10.8)

## 2021-07-26 LAB — LIPID PANEL WITH LDL/HDL RATIO
Cholesterol, Total: 219 mg/dL — ABNORMAL HIGH (ref 100–199)
HDL: 38 mg/dL — ABNORMAL LOW (ref >39)
LDL Chol Calc (NIH): 155 mg/dL — ABNORMAL HIGH (ref 0–99)
LDL/HDL Ratio: 4.1 ratio — ABNORMAL HIGH (ref 0.0–3.2)
Triglycerides: 145 mg/dL (ref 0–149)
VLDL Cholesterol Cal: 26 mg/dL (ref 5–40)

## 2021-07-26 LAB — HEMOGLOBIN A1C
Est. average glucose Bld gHb Est-mCnc: 111 mg/dL
Hgb A1c MFr Bld: 5.5 % (ref 4.8–5.6)

## 2021-07-26 LAB — COMPREHENSIVE METABOLIC PANEL
ALT: 17 IU/L (ref 0–32)
AST: 19 IU/L (ref 0–40)
Albumin/Globulin Ratio: 1.7 (ref 1.2–2.2)
Albumin: 4.7 g/dL (ref 3.8–4.8)
Alkaline Phosphatase: 43 IU/L — ABNORMAL LOW (ref 44–121)
BUN/Creatinine Ratio: 19 (ref 9–23)
BUN: 13 mg/dL (ref 6–20)
Bilirubin Total: 0.4 mg/dL (ref 0.0–1.2)
CO2: 21 mmol/L (ref 20–29)
Calcium: 9.7 mg/dL (ref 8.7–10.2)
Chloride: 100 mmol/L (ref 96–106)
Creatinine, Ser: 0.68 mg/dL (ref 0.57–1.00)
Globulin, Total: 2.8 g/dL (ref 1.5–4.5)
Glucose: 85 mg/dL (ref 70–99)
Potassium: 4.5 mmol/L (ref 3.5–5.2)
Sodium: 136 mmol/L (ref 134–144)
Total Protein: 7.5 g/dL (ref 6.0–8.5)
eGFR: 117 mL/min/{1.73_m2} (ref >59)

## 2021-07-26 LAB — TSH: TSH: 3.36 u[IU]/mL (ref 0.450–4.500)

## 2021-07-26 LAB — INSULIN, RANDOM: INSULIN: 11.5 u[IU]/mL (ref 2.6–24.9)

## 2021-07-26 LAB — VITAMIN B12: Vitamin B-12: 788 pg/mL (ref 232–1245)

## 2021-07-26 LAB — VITAMIN D 25 HYDROXY (VIT D DEFICIENCY, FRACTURES): Vit D, 25-Hydroxy: 13.7 ng/mL — ABNORMAL LOW (ref 30.0–100.0)

## 2021-08-01 ENCOUNTER — Telehealth (HOSPITAL_COMMUNITY): Payer: 59 | Admitting: Psychiatry

## 2021-08-05 ENCOUNTER — Encounter (INDEPENDENT_AMBULATORY_CARE_PROVIDER_SITE_OTHER): Payer: Self-pay | Admitting: Family Medicine

## 2021-08-05 ENCOUNTER — Ambulatory Visit (INDEPENDENT_AMBULATORY_CARE_PROVIDER_SITE_OTHER): Payer: 59 | Admitting: Family Medicine

## 2021-08-05 ENCOUNTER — Other Ambulatory Visit: Payer: Self-pay

## 2021-08-05 VITALS — BP 125/85 | HR 92 | Temp 97.9°F | Ht 62.0 in | Wt 237.0 lb

## 2021-08-05 DIAGNOSIS — E782 Mixed hyperlipidemia: Secondary | ICD-10-CM | POA: Diagnosis not present

## 2021-08-05 DIAGNOSIS — E669 Obesity, unspecified: Secondary | ICD-10-CM

## 2021-08-05 DIAGNOSIS — Z9189 Other specified personal risk factors, not elsewhere classified: Secondary | ICD-10-CM

## 2021-08-05 DIAGNOSIS — E8881 Metabolic syndrome: Secondary | ICD-10-CM

## 2021-08-05 DIAGNOSIS — E559 Vitamin D deficiency, unspecified: Secondary | ICD-10-CM | POA: Diagnosis not present

## 2021-08-05 DIAGNOSIS — Z6841 Body Mass Index (BMI) 40.0 and over, adult: Secondary | ICD-10-CM

## 2021-08-08 MED ORDER — METFORMIN HCL 500 MG PO TABS: 500.0000 mg | ORAL_TABLET | Freq: Two times a day (BID) | ORAL | 0 refills | Status: DC

## 2021-08-08 MED ORDER — CHOLECALCIFEROL 1.25 MG (50000 UT) PO TABS: 50000.0000 [IU] | ORAL_TABLET | ORAL | 0 refills | Status: DC

## 2021-08-08 NOTE — Progress Notes (Signed)
? ? ? ?Chief Complaint:  ? ?OBESITY ?[name] is here to discuss her progress with her obesity treatment plan along with follow-up of her obesity related diagnoses. [name] is on the Category 3 Plan and states she is following her eating plan approximately 70% of the time. [name] states she is doing 0 minutes 0 times per week. ? ?Today's visit was #: 2 ?Starting weight: 240 lbs ?Starting date: 07/22/2021 ?Today's weight: 237 lbs ?Today's date: 08/05/2021 ?Total lbs lost to date: 3 ?Total lbs lost since last in-office visit: 3 ? ?Interim History: [name] has done well with weight loss on her Category 3 plan. Her hunger is mostly controlled and she feels she can do well with her food choices.  ? ?Subjective:  ? ?1. Mixed hyperlipidemia ?[name] has an elevated LDL and low HDL. She is not on a statin. She is working on diet, exercise, and weight loss. I discussed labs with the patient today. ? ?2. Vitamin D deficiency ?[name] is not on Vitamin D, and her level is very low and she notes fatigue. I discussed labs with the patient today. ? ?3. Insulin resistance ?[name]'s glucose and A1c are within normal limits, but her fasting insulin is elevated. She notes polyphagia. I discussed labs with the patient today.  ? ?4. At risk of diabetes mellitus ?[name] is at higher than average risk for developing diabetes due to her obesity. ? ?Assessment/Plan:  ? ?1. Mixed hyperlipidemia ?[name] will continue her diet, exercise, and weight loss. We will recheck labs in 3 months.  ? ?2. Vitamin D deficiency ?[name] agreed to start Vitamin D 50,000 IU with no refills. We will recheck labs in 3 months. ? ?- Cholecalciferol 1.25 MG (50000 UT) TABS; Take 50,000 Units by mouth once a week.  Dispense: 4 tablet; Refill: 0 ? ?3. Insulin resistance ?[name] agreed to start metformin 500 mg q daily with food, with no refills. We will recheck labs in 3 months. ? ?- metFORMIN (GLUCOPHAGE) 500 MG tablet; Take 1 tablet (500 mg total) by mouth 2 (two) times daily with  a meal.  Dispense: 30 tablet; Refill: 0 ? ?4. At risk of diabetes mellitus ?[name] was given approximately 30 minutes of diabetic education and counseling today. We discussed intensive lifestyle modifications today with an emphasis on weight loss as well as increasing exercise and decreasing simple carbohydrates in her diet. We also reviewed medication options with an emphasis on risk versus benefits of those discussed. ? ?Repetitive spaced learning was employed today to elicit superior memory formation and behavioral change. ? ?5. Obesity, Current BMI 43.4 ?[name] is currently in the action stage of change. As such, her goal is to continue with weight loss efforts. She has agreed to the Category 3 Plan.  ? ?Behavioral modification strategies: meal planning and cooking strategies. ? ?[name] has agreed to follow-up with our clinic in 2 weeks. She was informed of the importance of frequent follow-up visits to maximize her success with intensive lifestyle modifications for her multiple health conditions.  ? ?Objective:  ? ?Blood pressure 125/85, pulse 92, temperature 97.9 ?F (36.6 ?C), height 5\' 2"  (1.575 m), weight 237 lb (107.5 kg), SpO2 100 %. ?Body mass index is 43.35 kg/m?. ? ?General: Cooperative, alert, well developed, in no acute distress. ?HEENT: Conjunctivae and lids unremarkable. ?Cardiovascular: Regular rhythm.  ?Lungs: Normal work of breathing. ?Neurologic: No focal deficits.  ? ?Lab Results  ?Component Value Date  ? CREATININE 0.68 07/22/2021  ? BUN 13 07/22/2021  ? NA 136 07/22/2021  ?  K 4.5 07/22/2021  ? CL 100 07/22/2021  ? CO2 21 07/22/2021  ? ?Lab Results  ?Component Value Date  ? ALT 17 07/22/2021  ? AST 19 07/22/2021  ? ALKPHOS 43 (L) 07/22/2021  ? BILITOT 0.4 07/22/2021  ? ?Lab Results  ?Component Value Date  ? HGBA1C 5.5 07/22/2021  ? ?Lab Results  ?Component Value Date  ? INSULIN 11.5 07/22/2021  ? ?Lab Results  ?Component Value Date  ? TSH 3.360 07/22/2021  ? ?Lab Results  ?Component Value Date  ?  CHOL 219 (H) 07/22/2021  ? HDL 38 (L) 07/22/2021  ? LDLCALC 155 (H) 07/22/2021  ? TRIG 145 07/22/2021  ? ?Lab Results  ?Component Value Date  ? VD25OH 13.7 (L) 07/22/2021  ? ?Lab Results  ?Component Value Date  ? WBC 7.9 07/22/2021  ? HGB 13.4 07/22/2021  ? HCT 39.1 07/22/2021  ? MCV 92 07/22/2021  ? PLT 150 07/22/2021  ? ?No results found for: IRON, TIBC, FERRITIN ? ?Attestation Statements:  ? ?Reviewed by clinician on day of visit: allergies, medications, problem list, medical history, surgical history, family history, social history, and previous encounter notes. ? ? ?I, Burt Knack, am acting as transcriptionist for Quillian Quince, MD. ? ?I have reviewed the above documentation for accuracy and completeness, and I agree with the above. -  Quillian Quince, MD ? ? ?

## 2021-08-08 NOTE — Telephone Encounter (Signed)
Pt last seen by Dr. Beasley.  

## 2021-08-17 ENCOUNTER — Encounter (INDEPENDENT_AMBULATORY_CARE_PROVIDER_SITE_OTHER): Payer: Self-pay | Admitting: Family Medicine

## 2021-08-17 NOTE — Progress Notes (Signed)
?TeleHealth Visit:  ?Due to the COVID-19 pandemic, this visit was completed with telemedicine (audio/video) technology to reduce patient and provider exposure as well as to preserve personal protective equipment.  ? ?[name] has verbally consented to this TeleHealth visit. The patient is located at home, the provider is located at home. The participants in this visit include the listed provider and patient. The visit was conducted today via MyChart video. ? ?OBESITY ?[name] is here to discuss her progress with her obesity treatment plan along with follow-up of her obesity related diagnoses.  ? ?Today's visit was # 3 ?Starting weight: 240 lbs ?Starting date: 07/22/21 ?Total weight loss: 3 lbs at last in office visit. ?Weight at last in office visit: 237 lbs ?Today's reported weight: 235 lbs 5 days ago. ? ?Nutrition Plan: the Category 3 Plan 75% of the time. ?Hunger is well controlled. Cravings are moderately controlled.  ?Current exercise: none ? 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  When she is on her travel assignment she is able to stick to the plan very well other than missing her lunch meal.  At home it is a bit more difficult with her wife and 2 school-aged children.  Generally she has breakfast right before she goes to work in the evening and has her dinner meal during the night.  She misses the lunch meal.  If she has meat that she has cooked she has the 8 ounce portion but if not she has a frozen meal.  Water intake is good. ?She notes occasional cravings for crunchy foods such as chips or fried foods. ? ?Assessment/Plan:  ?1. Insulin Resistance ?[name] has had elevated fasting insulin readings. Goal is HgbA1c < 5.7, fasting insulin closer to 5.   ?She denies polyphagia. ?Medication(s): metformin 500 mg daily.  Prescription accidentally sent at prior appointment for twice daily but only 30 pills  provided.  She has not started this medicine yet because it was sent to the pharmacy near her home and she is on her travel assignment. ?Lab Results  ?Component Value Date  ? HGBA1C 5.5 07/22/2021  ? ?Lab Results  ?Component Value Date  ? INSULIN 11.5 07/22/2021  ? ?Plan ?She will start metformin.  We discussed possible side effects. ? ?2. Vitamin D Deficiency ?Vitamin D is not at goal of 50.  Level is very low at 13.7. ?She is on weekly prescription Vitamin D 50,000 IU.  ?Lab Results  ?Component Value Date  ? VD25OH 13.7 (L) 07/22/2021  ? ? ?Plan: ?Continue prescription vitamin D 50,000 IU weekly. ? ?3. Obesity: Current BMI 43.34 ?[name] is currently in the action stage of change. As such, her goal is to continue with weight loss efforts. She has agreed to the Category 3 Plan and keeping a food journal and adhering to recommended goals of 450-600 calories and 40 protein at supper. ? ?Exercise goals: No exercise has been prescribed at this time. ? ?Behavioral modification strategies: increasing lean protein intake and no skipping meals. ?If she has a frozen meal for dinner she will augment with more protein. ?Encouraged her to try to have a protein bar with at least 20 g of protein and less than 350 cal at some time during her shift to make up for missing lunch. ? ?[name] has agreed to follow-up with our clinic in 2 weeks.  ? ?No orders of the defined types were placed  in this encounter. ? ? ?There are no discontinued medications.  ? ?No orders of the defined types were placed in this encounter. ?   ? ?Objective:  ? ?VITALS: Per patient if applicable, see vitals. ?GENERAL: Alert and in no acute distress. ?CARDIOPULMONARY: No increased WOB. Speaking in clear sentences.  ?PSYCH: Pleasant and cooperative. Speech normal rate and rhythm. Affect is appropriate. Insight and judgement are appropriate. Attention is focused, linear, and appropriate.  ?NEURO: Oriented as arrived to appointment on time with no prompting.  ? ?Lab  Results  ?Component Value Date  ? CREATININE 0.68 07/22/2021  ? BUN 13 07/22/2021  ? NA 136 07/22/2021  ? K 4.5 07/22/2021  ? CL 100 07/22/2021  ? CO2 21 07/22/2021  ? ?Lab Results  ?Component Value Date  ? ALT 17 07/22/2021  ? AST 19 07/22/2021  ? ALKPHOS 43 (L) 07/22/2021  ? BILITOT 0.4 07/22/2021  ? ?Lab Results  ?Component Value Date  ? HGBA1C 5.5 07/22/2021  ? ?Lab Results  ?Component Value Date  ? INSULIN 11.5 07/22/2021  ? ?Lab Results  ?Component Value Date  ? TSH 3.360 07/22/2021  ? ?Lab Results  ?Component Value Date  ? CHOL 219 (H) 07/22/2021  ? HDL 38 (L) 07/22/2021  ? LDLCALC 155 (H) 07/22/2021  ? TRIG 145 07/22/2021  ? ?Lab Results  ?Component Value Date  ? WBC 7.9 07/22/2021  ? HGB 13.4 07/22/2021  ? HCT 39.1 07/22/2021  ? MCV 92 07/22/2021  ? PLT 150 07/22/2021  ? ?No results found for: IRON, TIBC, FERRITIN ?Lab Results  ?Component Value Date  ? VD25OH 13.7 (L) 07/22/2021  ? ? ?Attestation Statements:  ? ?Reviewed by clinician on day of visit: allergies, medications, problem list, medical history, surgical history, family history, social history, and previous encounter notes. ? ?Time spent on visit including pre-visit chart review and post-visit charting and care was 33 minutes.  ? ? ?

## 2021-08-17 NOTE — Telephone Encounter (Signed)
Please advise 

## 2021-08-18 ENCOUNTER — Encounter (INDEPENDENT_AMBULATORY_CARE_PROVIDER_SITE_OTHER): Payer: Self-pay | Admitting: Family Medicine

## 2021-08-18 ENCOUNTER — Telehealth (INDEPENDENT_AMBULATORY_CARE_PROVIDER_SITE_OTHER): Payer: 59 | Admitting: Family Medicine

## 2021-08-18 DIAGNOSIS — E559 Vitamin D deficiency, unspecified: Secondary | ICD-10-CM | POA: Diagnosis not present

## 2021-08-18 DIAGNOSIS — E88819 Insulin resistance, unspecified: Secondary | ICD-10-CM | POA: Insufficient documentation

## 2021-08-18 DIAGNOSIS — Z6841 Body Mass Index (BMI) 40.0 and over, adult: Secondary | ICD-10-CM | POA: Diagnosis not present

## 2021-08-18 DIAGNOSIS — E8881 Metabolic syndrome: Secondary | ICD-10-CM | POA: Diagnosis not present

## 2021-08-18 DIAGNOSIS — E669 Obesity, unspecified: Secondary | ICD-10-CM | POA: Diagnosis not present

## 2021-08-30 NOTE — Telephone Encounter (Signed)
Appointment change request. 

## 2021-09-01 ENCOUNTER — Ambulatory Visit (INDEPENDENT_AMBULATORY_CARE_PROVIDER_SITE_OTHER): Payer: 59 | Admitting: Family Medicine

## 2021-09-08 ENCOUNTER — Ambulatory Visit (INDEPENDENT_AMBULATORY_CARE_PROVIDER_SITE_OTHER): Payer: 59 | Admitting: Family Medicine

## 2021-09-08 ENCOUNTER — Encounter (INDEPENDENT_AMBULATORY_CARE_PROVIDER_SITE_OTHER): Payer: Self-pay | Admitting: Family Medicine

## 2021-09-08 VITALS — BP 113/78 | HR 64 | Temp 98.3°F | Ht 62.0 in | Wt 238.0 lb

## 2021-09-08 DIAGNOSIS — E559 Vitamin D deficiency, unspecified: Secondary | ICD-10-CM | POA: Diagnosis not present

## 2021-09-08 DIAGNOSIS — Z9189 Other specified personal risk factors, not elsewhere classified: Secondary | ICD-10-CM

## 2021-09-08 DIAGNOSIS — E8881 Metabolic syndrome: Secondary | ICD-10-CM | POA: Diagnosis not present

## 2021-09-08 DIAGNOSIS — E669 Obesity, unspecified: Secondary | ICD-10-CM | POA: Diagnosis not present

## 2021-09-08 DIAGNOSIS — Z6841 Body Mass Index (BMI) 40.0 and over, adult: Secondary | ICD-10-CM | POA: Diagnosis not present

## 2021-09-08 MED ORDER — CHOLECALCIFEROL 1.25 MG (50000 UT) PO TABS: 50000.0000 [IU] | ORAL_TABLET | ORAL | 0 refills | Status: AC

## 2021-09-08 MED ORDER — METFORMIN HCL 500 MG PO TABS: 500.0000 mg | ORAL_TABLET | Freq: Two times a day (BID) | ORAL | 0 refills | Status: AC

## 2021-09-14 NOTE — Progress Notes (Addendum)
TeleHealth Visit:  This visit was completed with telemedicine (audio/video) technology. [name] has verbally consented to this TeleHealth visit. The patient is located at home, the provider is located at home. The participants in this visit include the listed provider and patient. The visit was conducted today via MyChart video.  OBESITY [name] is here to discuss her progress with her obesity treatment plan along with follow-up of her obesity related diagnoses.   Today's visit was # 5 Starting weight: 240 lbs Starting date: 07/22/21 Weight at last in office visit: 238 lbs on 09/08/21 Total weight loss: 2 lbs at last in office visit on 09/08/21. Today's reported weight:  No weight reported.   Nutrition Plan: the Category 3 Plan and keeping a food journal and adhering to recommended goals of 450-600 calories and 40 gms protein.  Hunger is well controlled. Cravings are well controlled.  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  This has caused some changes to her eating pattern.  When she is home with her family she tends to eat out more, usually fast food. Water intake is good. She does not always get in a meal when working due to not getting a meal break.  She keeps frozen meals at work.  Assessment/Plan:  1. Insulin Resistance Nikol has had elevated fasting insulin readings. Goal is HgbA1c < 5.7, fasting insulin closer to 5.   She denies polyphagia. Medication(s): Metformin 500 mg twice daily with meals. Lab Results  Component Value Date   HGBA1C 5.5 07/22/2021   Lab Results  Component Value Date   INSULIN 11.5 07/22/2021    Plan Continue metformin. Work on reducing simple carbs.  2. Obesity: Current BMI 43.52 [name] is currently in the action stage of change. As such, her goal is to continue with weight loss efforts.  She has agreed to the Category 3 Plan and  keeping a food journal and adhering to recommended goals of 450-600 calories and 40 gms protein.  Also has breakfast options.  Exercise goals: She plans on starting to go to the gym  Behavioral modification strategies: increasing lean protein intake, decreasing simple carbohydrates, decreasing eating out, and no skipping meals.  Dining out handout sent via MyChart.  [name] has agreed to follow-up with our clinic in 3 weeks.   No orders of the defined types were placed in this encounter.   There are no discontinued medications.   No orders of the defined types were placed in this encounter.     Objective:   VITALS: Per patient if applicable, see vitals. GENERAL: Alert and in no acute distress. CARDIOPULMONARY: No increased WOB. Speaking in clear sentences.  PSYCH: Pleasant and cooperative. Speech normal rate and rhythm. Affect is appropriate. Insight and judgement are appropriate. Attention is focused, linear, and appropriate.  NEURO: Oriented as arrived to appointment on time with no prompting.   Lab Results  Component Value Date   CREATININE 0.68 07/22/2021   BUN 13 07/22/2021   NA 136 07/22/2021   K 4.5 07/22/2021   CL 100 07/22/2021   CO2 21 07/22/2021   Lab Results  Component Value Date   ALT 17 07/22/2021   AST 19 07/22/2021   ALKPHOS 43 (L) 07/22/2021   BILITOT 0.4 07/22/2021   Lab Results  Component Value Date   HGBA1C 5.5 07/22/2021   Lab Results  Component Value Date   INSULIN  11.5 07/22/2021   Lab Results  Component Value Date   TSH 3.360 07/22/2021   Lab Results  Component Value Date   CHOL 219 (H) 07/22/2021   HDL 38 (L) 07/22/2021   LDLCALC 155 (H) 07/22/2021   TRIG 145 07/22/2021   Lab Results  Component Value Date   WBC 7.9 07/22/2021   HGB 13.4 07/22/2021   HCT 39.1 07/22/2021   MCV 92 07/22/2021   PLT 150 07/22/2021   No results found for: IRON, TIBC, FERRITIN Lab Results  Component Value Date   VD25OH 13.7 (L) 07/22/2021     Attestation Statements:   Reviewed by clinician on day of visit: allergies, medications, problem list, medical history, surgical history, family history, social history, and previous encounter notes.

## 2021-09-15 ENCOUNTER — Encounter (INDEPENDENT_AMBULATORY_CARE_PROVIDER_SITE_OTHER): Payer: Self-pay | Admitting: Family Medicine

## 2021-09-15 ENCOUNTER — Encounter (INDEPENDENT_AMBULATORY_CARE_PROVIDER_SITE_OTHER): Payer: Self-pay

## 2021-09-15 ENCOUNTER — Telehealth (INDEPENDENT_AMBULATORY_CARE_PROVIDER_SITE_OTHER): Payer: 59 | Admitting: Family Medicine

## 2021-09-15 DIAGNOSIS — E669 Obesity, unspecified: Secondary | ICD-10-CM

## 2021-09-15 DIAGNOSIS — Z6841 Body Mass Index (BMI) 40.0 and over, adult: Secondary | ICD-10-CM | POA: Diagnosis not present

## 2021-09-15 DIAGNOSIS — E8881 Metabolic syndrome: Secondary | ICD-10-CM | POA: Diagnosis not present

## 2021-09-15 DIAGNOSIS — E88819 Insulin resistance, unspecified: Secondary | ICD-10-CM

## 2021-09-22 NOTE — Progress Notes (Signed)
Chief Complaint:   OBESITY [name] is here to discuss her progress with her obesity treatment plan along with follow-up of her obesity related diagnoses. [name] is on the Category 3 Plan and keeping a food journal and adhering to recommended goals of 450-600 calories and 40 grams of protein at supper daily and states she is following her eating plan approximately 30% of the time. [name] states she is doing 0 minutes 0 times per week.  Today's visit was #: 4 Starting weight: 240 lbs Starting date: 07/22/2021 Today's weight: 238 lbs Today's date: 09/08/2021 Total lbs lost to date: 2 Total lbs lost since last in-office visit: 0  Interim History: [name] continues to work on weight loss, but she has been deviating from her plan more. She likes the simplicity of the Category 3 plan, but she is getting bored with the breakfast options.   Subjective:   1. Insulin resistance [name] is stable on metformin. She had some initial GI upset but then this resolved.   2. Vitamin D deficiency [name] is on Vitamin D, and her level is not yet at goal. No side effects were noted.   3. At risk for heart disease [name] is at higher than average risk for cardiovascular disease due to obesity.  Assessment/Plan:   1. Insulin resistance We will refill metformin for 1 month. Tailer will continue to work on weight loss, exercise, and decreasing simple carbohydrates to help decrease the risk of diabetes. [name] agreed to follow-up with [place] as directed to closely monitor her progress.  - metFORMIN (GLUCOPHAGE) 500 MG tablet; Take 1 tablet (500 mg total) by mouth 2 (two) times daily with a meal.  Dispense: 30 tablet; Refill: 0  2. Vitamin D deficiency We will refill prescription Vitamin D for 1 month. [name] will follow-up for routine testing of Vitamin D, at least 2-3 times per year to avoid over-replacement.  - Cholecalciferol 1.25 MG (50000 UT) TABS; Take 50,000 Units by mouth once a week.  Dispense: 4 tablet;  Refill: 0  3. At risk for heart disease [name] was given approximately 15 minutes of coronary artery disease prevention counseling today. She is 36 y.o. female and has risk factors for heart disease including obesity. We discussed intensive lifestyle modifications today with an emphasis on specific weight loss instructions and strategies.  Repetitive spaced learning was employed today to elicit superior memory formation and behavioral change.   4. Obesity, Current BMI 43.6 [name] is currently in the action stage of change. As such, her goal is to continue with weight loss efforts. She has agreed to the Category 3 Plan with breakfast options.   Exercise goals: Add strengthening exercises.   Behavioral modification strategies: increasing lean protein intake and no skipping meals.  [name] has agreed to follow-up with our clinic in 2 weeks. She was informed of the importance of frequent follow-up visits to maximize her success with intensive lifestyle modifications for her multiple health conditions.   Objective:   Blood pressure 113/78, pulse 64, temperature 98.3 F (36.8 C), height 5\' 2"  (1.575 m), weight 238 lb (108 kg), SpO2 100 %. Body mass index is 43.53 kg/m.  General: Cooperative, alert, well developed, in no acute distress. HEENT: Conjunctivae and lids unremarkable. Cardiovascular: Regular rhythm.  Lungs: Normal work of breathing. Neurologic: No focal deficits.   Lab Results  Component Value Date   CREATININE 0.68 07/22/2021   BUN 13 07/22/2021   NA 136 07/22/2021   K 4.5 07/22/2021   CL  100 07/22/2021   CO2 21 07/22/2021   Lab Results  Component Value Date   ALT 17 07/22/2021   AST 19 07/22/2021   ALKPHOS 43 (L) 07/22/2021   BILITOT 0.4 07/22/2021   Lab Results  Component Value Date   HGBA1C 5.5 07/22/2021   Lab Results  Component Value Date   INSULIN 11.5 07/22/2021   Lab Results  Component Value Date   TSH 3.360 07/22/2021   Lab Results  Component Value  Date   CHOL 219 (H) 07/22/2021   HDL 38 (L) 07/22/2021   LDLCALC 155 (H) 07/22/2021   TRIG 145 07/22/2021   Lab Results  Component Value Date   VD25OH 13.7 (L) 07/22/2021   Lab Results  Component Value Date   WBC 7.9 07/22/2021   HGB 13.4 07/22/2021   HCT 39.1 07/22/2021   MCV 92 07/22/2021   PLT 150 07/22/2021   No results found for: IRON, TIBC, FERRITIN  Attestation Statements:   Reviewed by clinician on day of visit: allergies, medications, problem list, medical history, surgical history, family history, social history, and previous encounter notes.   I, Burt Knack, am acting as transcriptionist for Quillian Quince, MD.  I have reviewed the above documentation for accuracy and completeness, and I agree with the above. -  Quillian Quince, MD

## 2021-10-04 ENCOUNTER — Ambulatory Visit (INDEPENDENT_AMBULATORY_CARE_PROVIDER_SITE_OTHER): Payer: 59 | Admitting: Family Medicine

## 2021-10-17 NOTE — Progress Notes (Deleted)
TeleHealth Visit:  This visit was completed with telemedicine (audio/video) technology. [name] has verbally consented to this TeleHealth visit. The patient is located at home, the provider is located at home. The participants in this visit include the listed provider and patient. The visit was conducted today via MyChart video.  OBESITY [name] is here to discuss her progress with her obesity treatment plan along with follow-up of her obesity related diagnoses.   Today's visit was # 6 Starting weight: 240 lbs Starting date: 07/22/21 Weight at last in office visit: 238 lbs on 09/08/21 Total weight loss: 2 lbs at last in office visit on 09/08/21. Today's reported weight:  *** lbs No weight reported.  Nutrition Plan: the Category 3 Plan and keeping a food journal and adhering to recommended goals of 450-600 calories and 40 protein at supper. Hunger is {EWCONTROLASSESSMENT:24261}. Cravings are {EWCONTROLASSESSMENT:24261}.  Current exercise: {exercise types:16438}  Interim History: ***  Assessment/Plan:  1. ***  2. ***  3. ***  Obesity: Current BMI *** [name] {CHL AMB IS/IS NOT:210130109} currently in the action stage of change. As such, her goal is to {MWMwtloss#1:210800005}.  She has agreed to {MWMwtlossportion/plan2:23431}.   Exercise goals: {MWM EXERCISE RECS:23473}  Behavioral modification strategies: {MWMwtlossdietstrategies3:23432}.  [name] has agreed to follow-up with our clinic in {NUMBER 1-10:22536} weeks.   No orders of the defined types were placed in this encounter.   There are no discontinued medications.   No orders of the defined types were placed in this encounter.     Objective:   VITALS: Per patient if applicable, see vitals. GENERAL: Alert and in no acute distress. CARDIOPULMONARY: No increased WOB. Speaking in clear sentences.  PSYCH: Pleasant and cooperative. Speech normal rate and rhythm. Affect is appropriate. Insight and judgement are  appropriate. Attention is focused, linear, and appropriate.  NEURO: Oriented as arrived to appointment on time with no prompting.   Lab Results  Component Value Date   CREATININE 0.68 07/22/2021   BUN 13 07/22/2021   NA 136 07/22/2021   K 4.5 07/22/2021   CL 100 07/22/2021   CO2 21 07/22/2021   Lab Results  Component Value Date   ALT 17 07/22/2021   AST 19 07/22/2021   ALKPHOS 43 (L) 07/22/2021   BILITOT 0.4 07/22/2021   Lab Results  Component Value Date   HGBA1C 5.5 07/22/2021   Lab Results  Component Value Date   INSULIN 11.5 07/22/2021   Lab Results  Component Value Date   TSH 3.360 07/22/2021   Lab Results  Component Value Date   CHOL 219 (H) 07/22/2021   HDL 38 (L) 07/22/2021   LDLCALC 155 (H) 07/22/2021   TRIG 145 07/22/2021   Lab Results  Component Value Date   WBC 7.9 07/22/2021   HGB 13.4 07/22/2021   HCT 39.1 07/22/2021   MCV 92 07/22/2021   PLT 150 07/22/2021   No results found for: IRON, TIBC, FERRITIN Lab Results  Component Value Date   VD25OH 13.7 (L) 07/22/2021    Attestation Statements:   Reviewed by clinician on day of visit: allergies, medications, problem list, medical history, surgical history, family history, social history, and previous encounter notes.  ***(delete if time-based billing not used)Time spent on visit including pre-visit chart review and post-visit charting and care was *** minutes.  This time may include: -preparing to see the patient (e.g., review of tests) -obtaining and/or reviewing separately obtained history -performing a medically appropriate examination and/or evaluation -counseling and educating the patient/family/caregiver -ordering medications, tests,  or procedures -referring and communicating with other health care professionals (when not separately reported) -documenting clinical information in the electronic or other health record -independently interpreting results (not separately reported) and  communicating results to the patient/ family/caregiver -care coordination (not separately reported)

## 2021-10-18 ENCOUNTER — Telehealth (INDEPENDENT_AMBULATORY_CARE_PROVIDER_SITE_OTHER): Payer: 59 | Admitting: Family Medicine

## 2021-12-21 ENCOUNTER — Encounter (INDEPENDENT_AMBULATORY_CARE_PROVIDER_SITE_OTHER): Payer: Self-pay

## 2023-01-25 ENCOUNTER — Emergency Department: Payer: PRIVATE HEALTH INSURANCE

## 2023-01-25 ENCOUNTER — Emergency Department
Admission: EM | Admit: 2023-01-25 | Discharge: 2023-01-26 | Disposition: A | Discharge status: Home / Self Care | Payer: PRIVATE HEALTH INSURANCE | Attending: Emergency Medicine | Admitting: Emergency Medicine

## 2023-01-25 ENCOUNTER — Other Ambulatory Visit: Payer: Self-pay

## 2023-01-25 DIAGNOSIS — R1030 Lower abdominal pain, unspecified: Secondary | ICD-10-CM

## 2023-01-25 DIAGNOSIS — R109 Unspecified abdominal pain: Secondary | ICD-10-CM | POA: Diagnosis present

## 2023-01-25 DIAGNOSIS — N83202 Unspecified ovarian cyst, left side: Secondary | ICD-10-CM

## 2023-01-25 DIAGNOSIS — R197 Diarrhea, unspecified: Secondary | ICD-10-CM

## 2023-01-25 LAB — URINALYSIS, ROUTINE W REFLEX MICROSCOPIC
Bacteria, UA: NONE SEEN
Bilirubin Urine: NEGATIVE
Glucose, UA: NEGATIVE mg/dL
Hgb urine dipstick: NEGATIVE
Ketones, ur: 5 mg/dL — AB
Leukocytes,Ua: NEGATIVE
Nitrite: NEGATIVE
Protein, ur: 100 mg/dL — AB
Specific Gravity, Urine: 1.044 — ABNORMAL HIGH (ref 1.005–1.030)
pH: 5 (ref 5.0–8.0)

## 2023-01-25 LAB — POC URINE PREG, ED: Preg Test, Ur: NEGATIVE

## 2023-01-25 LAB — CBC
HCT: 39.4 % (ref 36.0–46.0)
Hemoglobin: 12.8 g/dL (ref 12.0–15.0)
MCH: 29.3 pg (ref 26.0–34.0)
MCHC: 32.5 g/dL (ref 30.0–36.0)
MCV: 90.2 fL (ref 80.0–100.0)
Platelets: 191 10*3/uL (ref 150–400)
RBC: 4.37 MIL/uL (ref 3.87–5.11)
RDW: 13.2 % (ref 11.5–15.5)
WBC: 7.4 10*3/uL (ref 4.0–10.5)
nRBC: 0 % (ref 0.0–0.2)

## 2023-01-25 LAB — COMPREHENSIVE METABOLIC PANEL
ALT: 17 U/L (ref 0–44)
AST: 23 U/L (ref 15–41)
Albumin: 4.2 g/dL (ref 3.5–5.0)
Alkaline Phosphatase: 43 U/L (ref 38–126)
Anion gap: 13 (ref 5–15)
BUN: 18 mg/dL (ref 6–20)
CO2: 23 mmol/L (ref 22–32)
Calcium: 9.1 mg/dL (ref 8.9–10.3)
Chloride: 101 mmol/L (ref 98–111)
Creatinine, Ser: 1 mg/dL (ref 0.44–1.00)
GFR, Estimated: >60 mL/min (ref >60)
Glucose, Bld: 141 mg/dL — ABNORMAL HIGH (ref 70–99)
Potassium: 3.7 mmol/L (ref 3.5–5.1)
Sodium: 137 mmol/L (ref 135–145)
Total Bilirubin: 0.7 mg/dL (ref 0.3–1.2)
Total Protein: 7.5 g/dL (ref 6.5–8.1)

## 2023-01-25 LAB — TROPONIN I (HIGH SENSITIVITY): Troponin I (High Sensitivity): 2 ng/L (ref <18)

## 2023-01-25 LAB — LIPASE, BLOOD: Lipase: 28 U/L (ref 11–51)

## 2023-01-25 MED ORDER — LACTATED RINGERS IV BOLUS
1000.0000 mL | Freq: Once | INTRAVENOUS | Status: AC
  Administered 2023-01-25: 1000 mL via INTRAVENOUS

## 2023-01-25 MED ORDER — IOHEXOL 350 MG/ML SOLN
100.0000 mL | Freq: Once | INTRAVENOUS | Status: AC | PRN
  Administered 2023-01-25: 100 mL via INTRAVENOUS

## 2023-01-25 MED ORDER — KETOROLAC TROMETHAMINE 30 MG/ML IJ SOLN
15.0000 mg | Freq: Once | INTRAMUSCULAR | Status: AC
  Administered 2023-01-25: 15 mg via INTRAVENOUS
  Filled 2023-01-25: qty 1

## 2023-01-25 MED ORDER — ONDANSETRON HCL 4 MG/2ML IJ SOLN
4.0000 mg | Freq: Once | INTRAMUSCULAR | Status: AC
  Administered 2023-01-25: 4 mg via INTRAVENOUS
  Filled 2023-01-25: qty 2

## 2023-01-25 MED ORDER — ONDANSETRON 4 MG PO TBDP: 4.0000 mg | ORAL_TABLET | Freq: Once | ORAL | Status: DC | PRN

## 2023-01-25 NOTE — ED Provider Notes (Signed)
[facility] Provider Note    Event Date/Time   First MD Initiated Contact with Patient 01/25/23 2312     (approximate)   History   Abdominal Pain   HPI  [name] is a 37 y.o. female who presents to the ED for evaluation of Abdominal Pain   I reviewed [facility] bidirectional endoscopy from March.  Morbidly obese patient with history of GERD, anxiety.  Internal hemorrhoids on the colonoscopy, endoscopy with Schatzki ring and tiny hiatal hernia She reports no known history of ovarian cysts.  Patient presents to the ED alongside her spouse for evaluation of lower abdominal cramping and 3 episodes of diarrhea today.  Nausea but no emesis.  No vaginal discharge or bleeding, no urinary symptoms.  No known fevers, falls or injuries    Physical Exam   Triage Vital Signs: ED Triage Vitals [01/25/23 2030]  Encounter Vitals Group     BP (!) 141/92     Systolic BP Percentile      Diastolic BP Percentile      Pulse Rate 84     Resp (!) 22     Temp 99.4 F (37.4 C)     Temp Source Oral     SpO2 98 %     Weight 250 lb (113.4 kg)     Height 5\' 2"  (1.575 m)     Head Circumference      Peak Flow      Pain Score 7     Pain Loc      Pain Education      Exclude from Growth Chart     Most recent vital signs: Vitals:   01/25/23 2030 01/26/23 0007  BP: (!) 141/92 (!) 99/40  Pulse: 84 82  Resp: (!) 22 18  Temp: 99.4 F (37.4 C) 98.1 F (36.7 C)  SpO2: 98% 100%    General: Awake, no distress.  CV:  Good peripheral perfusion.  Resp:  Normal effort.  Abd:  No distention.  Mild LLQ tenderness without peritoneal features MSK:  No deformity noted.  Neuro:  No focal deficits appreciated. Other:     ED Results / Procedures / Treatments   Labs (all labs ordered are listed, but only abnormal results are displayed) Labs Reviewed  COMPREHENSIVE METABOLIC PANEL - Abnormal; Notable for the following components:      Result Value   Glucose, Bld 141 (*)     All other components within normal limits  URINALYSIS, ROUTINE W REFLEX MICROSCOPIC - Abnormal; Notable for the following components:   Color, Urine YELLOW (*)    APPearance HAZY (*)    Specific Gravity, Urine 1.044 (*)    Ketones, ur 5 (*)    Protein, ur 100 (*)    All other components within normal limits  LIPASE, BLOOD  CBC  POC URINE PREG, ED  TROPONIN I (HIGH SENSITIVITY)  TROPONIN I (HIGH SENSITIVITY)    EKG Sinus tachycardia 3 to 123 bpm.  Normal axis and intervals.  No clear signs of acute ischemia.  RADIOLOGY CT abdomen/pelvis interpreted by me without SBO  Official radiology report(s): CT ABDOMEN PELVIS W CONTRAST  Result Date: 01/25/2023 CLINICAL DATA:  Abdominal pain and diarrhea. EXAM: CT ABDOMEN AND PELVIS WITH CONTRAST TECHNIQUE: Multidetector CT imaging of the abdomen and pelvis was performed using the standard protocol following bolus administration of intravenous contrast. RADIATION DOSE REDUCTION: This exam was performed according to the departmental dose-optimization program which includes automated exposure control, adjustment of  the mA and/or kV according to patient size and/or use of iterative reconstruction technique. CONTRAST:  OMNIPAQUE IOHEXOL 350 MG/ML SOLN COMPARISON:  None Available. FINDINGS: Lower chest: The visualized lung bases are clear. No intra-abdominal free air.  No significant free fluid. Hepatobiliary: The liver is unremarkable. No acute the intention. The gallbladder is unremarkable. Pancreas: Unremarkable. No pancreatic ductal dilatation or surrounding inflammatory changes. Spleen: Normal in size without focal abnormality. Adrenals/Urinary Tract: The adrenal glands are unremarkable the kidneys, visualized ureters, and urinary bladder appear unremarkable. Stomach/Bowel: There is no bowel obstruction or active inflammation. The appendix is normal. Vascular/Lymphatic: The abdominal aorta and IVC are unremarkable. No portal venous gas. There is  no adenopathy. Reproductive: The uterus is anteverted and grossly unremarkable. There is a 2.8 cm right ovarian cyst or corpus luteum. No imaging follow-up. Apparent soft tissue fullness of the posterior pelvis may represent left ovarian tissue or dilated fallopian tube. Ultrasound may provide better evaluation if there is clinical concern for pelvic inflammatory disease. Other: None Musculoskeletal: No acute or significant osseous findings. IMPRESSION: 1. No bowel obstruction. Normal appendix. 2. Left ovarian tissue versus less likely dilated Fallopian tube. Ultrasound may provide better evaluation if there is clinical concern for pelvic inflammatory disease. Electronically Signed   By: Elgie Collard M.D.   On: 01/25/2023 23:58    PROCEDURES and INTERVENTIONS:  Procedures  Medications  ondansetron (ZOFRAN-ODT) disintegrating tablet 4 mg (has no administration in time range)  lactated ringers bolus 1,000 mL (1,000 mLs Intravenous New Bag/Given 01/25/23 2341)  ondansetron (ZOFRAN) injection 4 mg (4 mg Intravenous Given 01/25/23 2344)  ketorolac (TORADOL) 30 MG/ML injection 15 mg (15 mg Intravenous Given 01/25/23 2344)  iohexol (OMNIPAQUE) 350 MG/ML injection 100 mL (100 mLs Intravenous Contrast Given 01/25/23 2346)     IMPRESSION / MDM / ASSESSMENT AND PLAN / ED COURSE  I reviewed the triage vital signs and the nursing notes.  Differential diagnosis includes, but is not limited to, ovarian torsion, PID, gastroenteritis or other viral syndrome, appendicitis, acute cystitis  {Patient presents with symptoms of an acute illness or injury that is potentially life-threatening.  Patient presents with her spouse for diarrhea and lower abdominal discomfort.  Mild localized tenderness but overall reassuring exam.  Essentially normal blood work with CBC, lipase and metabolic panel without acute features.  Urine with ketones suggestive of mild dehydration but no infectious features.  CT obtained which  questions a left ovarian cyst and thickened tissue but she has no symptoms to suggest PID.  I discussed ovarian cyst with her, outpatient care and close return precautions.  She is suitable for outpatient management.  Clinical Course as of 01/26/23 0046  Fri Jan 26, 2023  0031 Reassessed.  Feeling much better and has resolution of pain and she is appreciative.  Again clarified GYN symptoms, she has had no increased vaginal bleeding, discharge, pruritus or other concerns.  Clinically PID, torsion and other ovarian complications are extremely unlikely.  She was requesting discharge with the think is reasonable.  We discussed ovarian cysts, expectant management and return precautions [DS]    Clinical Course User Index [DS] Delton Prairie, MD     FINAL CLINICAL IMPRESSION(S) / ED DIAGNOSES   Final diagnoses:  Lower abdominal pain  Diarrhea, unspecified type  Cyst of left ovary     Rx / DC Orders   ED Discharge Orders     None        Note:  This document was prepared using Dragon voice  recognition software and may include unintentional dictation errors.   Delton Prairie, MD 01/26/23 [phone]

## 2023-01-25 NOTE — ED Triage Notes (Signed)
Pt to ED via POV c/o left lower abd pain. Pt reports this started this morning, and describes pain as cramping. Pt endorses some nausea and diarrhea, denies any CP, SOB, fevers.

## 2023-01-26 LAB — TROPONIN I (HIGH SENSITIVITY): Troponin I (High Sensitivity): 3 ng/L (ref <18)

## 2023-01-26 NOTE — Discharge Instructions (Addendum)
Please take Tylenol and ibuprofen/Advil for your pain.  It is safe to take them together, or to alternate them every few hours.  Take up to 1000mg of Tylenol at a time, up to 4 times per day.  Do not take more than 4000 mg of Tylenol in 24 hours.  For ibuprofen, take 400-600 mg, 3 - 4 times per day.  

## 2023-05-01 ENCOUNTER — Emergency Department: Payer: PRIVATE HEALTH INSURANCE

## 2023-05-01 ENCOUNTER — Other Ambulatory Visit: Payer: Self-pay

## 2023-05-01 ENCOUNTER — Emergency Department
Admission: EM | Admit: 2023-05-01 | Discharge: 2023-05-01 | Disposition: A | Discharge status: Home / Self Care | Payer: PRIVATE HEALTH INSURANCE | Attending: Emergency Medicine | Admitting: Emergency Medicine

## 2023-05-01 DIAGNOSIS — R079 Chest pain, unspecified: Secondary | ICD-10-CM | POA: Insufficient documentation

## 2023-05-01 LAB — CBC
HCT: 46.5 % — ABNORMAL HIGH (ref 36.0–46.0)
Hemoglobin: 15.1 g/dL — ABNORMAL HIGH (ref 12.0–15.0)
MCH: 30 pg (ref 26.0–34.0)
MCHC: 32.5 g/dL (ref 30.0–36.0)
MCV: 92.4 fL (ref 80.0–100.0)
Platelets: 185 10*3/uL (ref 150–400)
RBC: 5.03 MIL/uL (ref 3.87–5.11)
RDW: 13.1 % (ref 11.5–15.5)
WBC: 9 10*3/uL (ref 4.0–10.5)
nRBC: 0 % (ref 0.0–0.2)

## 2023-05-01 LAB — BASIC METABOLIC PANEL
Anion gap: 12 (ref 5–15)
BUN: 19 mg/dL (ref 6–20)
CO2: 25 mmol/L (ref 22–32)
Calcium: 9.5 mg/dL (ref 8.9–10.3)
Chloride: 100 mmol/L (ref 98–111)
Creatinine, Ser: 0.8 mg/dL (ref 0.44–1.00)
GFR, Estimated: >60 mL/min (ref >60)
Glucose, Bld: 104 mg/dL — ABNORMAL HIGH (ref 70–99)
Potassium: 3.8 mmol/L (ref 3.5–5.1)
Sodium: 137 mmol/L (ref 135–145)

## 2023-05-01 LAB — TROPONIN I (HIGH SENSITIVITY)
Troponin I (High Sensitivity): 2 ng/L (ref <18)
Troponin I (High Sensitivity): <2 ng/L (ref <18)

## 2023-05-01 LAB — POC URINE PREG, ED: Preg Test, Ur: NEGATIVE

## 2023-05-01 NOTE — ED Triage Notes (Signed)
Pt reports intermittent chest pain x5days, pt states tonight she became nauseous and lightheaded. Pain is described as squeeze/ache on left side. Pt denies cough or congestion

## 2023-05-01 NOTE — ED Provider Notes (Signed)
[facility] Provider Note    Event Date/Time   First MD Initiated Contact with Patient 05/01/23 [phone]     (approximate)   History   Chest Pain   HPI  [name] is a 37 year old female presenting to the emergency department for evaluation of chest pain.  Patient reports that intermittently over the past 5 days she will have episodes of chest pain described as a tightness lasting 1 to 2 minutes with center of her chest.  Does have occasional nausea with 1 episode of vomiting.  No abdominal pain.  Does report complete resolution of her symptoms in between episodes.  No history of similar.  Not specifically pleuritic, exertional, associated with food.  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      Physical Exam   Triage Vital Signs: ED Triage Vitals  Encounter Vitals Group     BP 05/01/23 0446 (!) 154/110     Systolic BP Percentile --      Diastolic BP Percentile --      Pulse Rate 05/01/23 0446 96     Resp 05/01/23 0446 18     Temp 05/01/23 0446 98.7 F (37.1 C)     Temp Source 05/01/23 0446 Oral     SpO2 05/01/23 0446 100 %     Weight 05/01/23 0445 246 lb (111.6 kg)     Height 05/01/23 0445 5\' 2"  (1.575 m)     Head Circumference --      Peak Flow --      Pain Score 05/01/23 0445 0     Pain Loc --      Pain Education --      Exclude from Growth Chart --     Most recent vital signs: Vitals:   05/01/23 0446 05/01/23 0905  BP: (!) 154/110 (!) 141/87  Pulse: 96 85  Resp: 18 18  Temp: 98.7 F (37.1 C) 98 F (36.7 C)  SpO2: 100% 100%     General: Awake, interactive  CV:  Regular rate, good peripheral perfusion.  Chest wall: No significant tenderness to palpation Resp:  Unlabored respirations, lungs clear to auscultation Abd:  Nondistended, nontender to palpation Neuro:  Symmetric facial movement, fluid speech   ED Results / Procedures / Treatments   Labs (all labs ordered are listed, but only abnormal  results are displayed) Labs Reviewed  CBC - Abnormal; Notable for the following components:      Result Value   Hemoglobin 15.1 (*)    HCT 46.5 (*)    All other components within normal limits  BASIC METABOLIC PANEL - Abnormal; Notable for the following components:   Glucose, Bld 104 (*)    All other components within normal limits  POC URINE PREG, ED  TROPONIN I (HIGH SENSITIVITY)  TROPONIN I (HIGH SENSITIVITY)     EKG EKG independently reviewed interpreted by myself (ER attending) demonstrates:  EKG demonstrates normal sinus rhythm at a rate of 90, PR 164, QRS 70, QTc 440, no acute ST changes  RADIOLOGY Imaging independently reviewed and interpreted by myself demonstrates:  CXR without focal consolidation  PROCEDURES:  Critical Care performed: No  Procedures   MEDICATIONS ORDERED IN ED: Medications - No data to display   IMPRESSION / MDM / ASSESSMENT AND PLAN / ED COURSE  I reviewed the triage vital signs and the nursing notes.  Differential diagnosis includes, but is not limited to, ACS, pneumonia, pneumothorax, low risk PE and PERC negative,  musculoskeletal strain  Patient's presentation is most consistent with acute presentation with potential threat to life or bodily function.  37 year old female presenting to the emergency department for evaluation of chest pain.  Vital stable on presentation.  Labs without critical derangements.  EKG nonischemic with negative troponin x 2.  Patient updated on results of workup.  She does have a primary care doctor and is comfortable with discharge with outpatient follow-up.  Strict return precautions provided.  Patient discharged stable condition.      FINAL CLINICAL IMPRESSION(S) / ED DIAGNOSES   Final diagnoses:  Acute chest pain     Rx / DC Orders   ED Discharge Orders     None        Note:  This document was prepared using Dragon voice recognition software and may include unintentional dictation errors.    Trinna Post, MD 05/01/23 [phone]

## 2023-05-01 NOTE — Discharge Instructions (Signed)
You were seen in the Emergency Department today for evaluation of your chest pain. Fortunately, your labs, EKG, and chest x- were overall reassuring against a emergency cause for your pain. Please follow-up with your primary doctor within the next few days for reevaluation. You can take Tylenol and ibuprofen as needed for your pain, unless there is another reason that you should not take these. Return to the ER for any new or worsening symptoms including worsening chest pain, difficulty breathing, or any other new or concerning symptoms that you believe warrants immediate attention.   

## 2023-07-24 ENCOUNTER — Encounter: Payer: Self-pay | Admitting: Physician Assistant

## 2023-07-24 ENCOUNTER — Ambulatory Visit
Admission: RE | Admit: 2023-07-24 | Discharge: 2023-07-24 | Disposition: A | Discharge status: Home / Self Care | Payer: PRIVATE HEALTH INSURANCE | Source: Ambulatory Visit | Attending: Physician Assistant | Admitting: Physician Assistant

## 2023-07-24 VITALS — BP 123/87 | HR 100 | Temp 98.9°F | Resp 18

## 2023-07-24 DIAGNOSIS — R5383 Other fatigue: Secondary | ICD-10-CM | POA: Diagnosis not present

## 2023-07-24 DIAGNOSIS — B349 Viral infection, unspecified: Secondary | ICD-10-CM | POA: Insufficient documentation

## 2023-07-24 DIAGNOSIS — R051 Acute cough: Secondary | ICD-10-CM | POA: Insufficient documentation

## 2023-07-24 DIAGNOSIS — R112 Nausea with vomiting, unspecified: Secondary | ICD-10-CM | POA: Insufficient documentation

## 2023-07-24 DIAGNOSIS — R197 Diarrhea, unspecified: Secondary | ICD-10-CM | POA: Diagnosis present

## 2023-07-24 DIAGNOSIS — R6883 Chills (without fever): Secondary | ICD-10-CM | POA: Insufficient documentation

## 2023-07-24 LAB — RESP PANEL BY RT-PCR (FLU A&B, COVID) ARPGX2
Influenza A by PCR: NEGATIVE
Influenza B by PCR: NEGATIVE
SARS Coronavirus 2 by RT PCR: NEGATIVE

## 2023-07-24 MED ORDER — PROMETHAZINE-DM 6.25-15 MG/5ML PO SYRP: 5.0000 mL | ORAL_SOLUTION | Freq: Four times a day (QID) | ORAL | 0 refills | Status: AC | PRN

## 2023-07-24 MED ORDER — ONDANSETRON 4 MG PO TBDP: 4.0000 mg | ORAL_TABLET | Freq: Three times a day (TID) | ORAL | 0 refills | Status: AC | PRN

## 2023-07-24 MED ORDER — ONDANSETRON 8 MG PO TBDP
8.0000 mg | ORAL_TABLET | Freq: Once | ORAL | Status: AC
  Administered 2023-07-24: 8 mg via ORAL

## 2023-07-24 NOTE — ED Triage Notes (Signed)
 Pt presents with a cough x 4 days. She developed vomiting, diarrhea and a headache last night.

## 2023-07-24 NOTE — Discharge Instructions (Addendum)
-  Pending flu, COVID testing.  Will contact you with any positive results. - Sent cough medicine and nausea medicine - You need to isolate until you are fever free for 24 hours and symptoms are improving. - Increase rest and fluids. - You should be seen again if you have uncontrolled fever, weakness or worsening breathing problem.

## 2023-07-24 NOTE — ED Provider Notes (Signed)
 [facility]    CSN: [id number] Arrival date & time: 07/24/23  1800      History   Chief Complaint Chief Complaint  Patient presents with   Cough   Emesis   Diarrhea   Headache    HPI [name] is a 38 y.o. female presenting for 3-day history of cough, congestion, fatigue, body aches, nausea/vomiting, diarrhea and headaches.  Denies fever, sore throat, chest pain, shortness of breath, abdominal pain.  Her son currently has vomiting and no URI symptoms.  She has tried taking OTC meds but unable to hold anything down.  Medical history significant for hypertension, insulin resistance, obesity and depression.  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  HPI  Past Medical History:  Diagnosis Date   Hypertension    MDD (major depressive disorder) 03/28/2021    Patient Active Problem List   Diagnosis Date Noted   Vitamin D deficiency 08/18/2021   Insulin resistance 08/18/2021   Class 3 severe obesity with serious comorbidity and body mass index (BMI) of 40.0 to 44.9 in adult [facility]) 08/18/2021   MDD (major depressive disorder) 03/28/2021    History reviewed. No pertinent surgical history.  OB History     Gravida  0   Para  0   Term  0   Preterm  0   AB  0   Living  0      SAB  0   IAB  0   Ectopic  0   Multiple  0   Live Births  0            Home Medications    Prior to Admission medications   Medication Sig Start Date End Date Taking? Authorizing Provider  cetirizine (ZYRTEC) 10 MG tablet Take by mouth.   Yes [provider]  Famotidine (PEPCID PO) Take by mouth.   Yes [provider]  ondansetron (ZOFRAN-ODT) 4 MG disintegrating tablet Take 1 tablet (4 mg total) by mouth every 8 (eight) hours as needed. 07/24/23  Yes Shirlee Latch, PA-C  promethazine-dextromethorphan (PROMETHAZINE-DM) 6.25-15 MG/5ML syrup Take 5 mLs by mouth 4 (four) times daily as needed. 07/24/23  Yes Shirlee Latch,  PA-C  Cholecalciferol 1.25 MG (50000 UT) TABS Take 50,000 Units by mouth once a week. 09/08/21   Quillian Quince D, MD  metFORMIN (GLUCOPHAGE) 500 MG tablet Take 1 tablet (500 mg total) by mouth 2 (two) times daily with a meal. 09/08/21   Wilder Glade, MD    Family History Family History  Problem Relation Age of Onset   Hypertension Mother    High Cholesterol Mother    Sleep apnea Mother    Obesity Mother    Healthy Sister    Hypertension Maternal Grandmother    Diabetes Maternal Grandmother     Social History Social History   Tobacco Use   Smoking status: Never   Smokeless tobacco: Never  Vaping Use   Vaping status: Never Used  Substance Use Topics   Alcohol use: Yes   Drug use: Not Currently     Allergies   Benzodiazepines and Penicillins   Review of Systems Review of Systems  Constitutional:  Positive for appetite change, chills and fatigue. Negative for diaphoresis and fever.  HENT:  Positive for congestion and rhinorrhea. Negative for ear pain, sinus pressure, sinus pain and sore throat.   Respiratory:  Positive for cough. Negative for shortness of breath.   Cardiovascular:  Negative for  chest pain.  Gastrointestinal:  Positive for diarrhea, nausea and vomiting. Negative for abdominal pain.  Musculoskeletal:  Positive for myalgias.  Skin:  Negative for rash.  Neurological:  Positive for headaches. Negative for weakness.  Hematological:  Negative for adenopathy.     Physical Exam Triage Vital Signs ED Triage Vitals  Encounter Vitals Group     BP 07/24/23 1821 123/87     Systolic BP Percentile --      Diastolic BP Percentile --      Pulse Rate 07/24/23 1821 100     Resp 07/24/23 1821 18     Temp 07/24/23 1821 98.9 F (37.2 C)     Temp Source 07/24/23 1821 Oral     SpO2 07/24/23 1821 96 %     Weight --      Height --      Head Circumference --      Peak Flow --      Pain Score 07/24/23 1820 0     Pain Loc --      Pain Education --      Exclude  from Growth Chart --    No data found.  Updated Vital Signs BP 123/87 (BP Location: Right Arm)   Pulse 100   Temp 98.9 F (37.2 C) (Oral)   Resp 18   LMP 07/14/2023   SpO2 96%      Physical Exam Vitals and nursing note reviewed.  Constitutional:      General: She is not in acute distress.    Appearance: Normal appearance. She is ill-appearing. She is not toxic-appearing.  HENT:     Head: Normocephalic and atraumatic.     Nose: Congestion present.     Mouth/Throat:     Mouth: Mucous membranes are moist.     Pharynx: Oropharynx is clear. No posterior oropharyngeal erythema.  Eyes:     General: No scleral icterus.       Right eye: No discharge.        Left eye: No discharge.     Conjunctiva/sclera: Conjunctivae normal.  Cardiovascular:     Rate and Rhythm: Normal rate and regular rhythm.     Heart sounds: Normal heart sounds.  Pulmonary:     Effort: Pulmonary effort is normal. No respiratory distress.     Breath sounds: Normal breath sounds.  Musculoskeletal:     Cervical back: Neck supple.  Skin:    General: Skin is dry.  Neurological:     General: No focal deficit present.     Mental Status: She is alert. Mental status is at baseline.     Motor: No weakness.     Gait: Gait normal.  Psychiatric:        Mood and Affect: Mood normal.        Behavior: Behavior normal.      UC Treatments / Results  Labs (all labs ordered are listed, but only abnormal results are displayed) Labs Reviewed  RESP PANEL BY RT-PCR (FLU A&B, COVID) [id number]    EKG   Radiology No results found.  Procedures Procedures (including critical care time)  Medications Ordered in UC Medications  ondansetron (ZOFRAN-ODT) disintegrating tablet 8 mg (has no administration in time range)    Initial Impression / Assessment and Plan / UC Course  I have reviewed the triage vital signs and the nursing notes.  Pertinent labs & imaging results that were available during my care of the  patient were reviewed by me and considered in my  medical decision making (see chart for details).   38 year old female with history of hypertension, obesity and insulin resistance presents for 3-day history of cough, fatigue, congestion, chills, nausea/vomiting diarrhea.  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  Vitals are stable and normal.  Ill-appearing nontoxic.  No acute distress.  On exam has nasal congestion.  Throat clear.  Chest clear.  Heart regular rate and rhythm.  Respiratory panel performed.  All negative.  Viral illness.  Supportive care encouraged with increased rest and fluids.  Patient was given 8 milligrams ODT Zofran in clinic for nausea.  Sent Promethazine DM and Zofran to pharmacy.  Reviewed close monitoring.  Thoroughly reviewed return and ER precautions.  Work note given.   Final Clinical Impressions(s) / UC Diagnoses   Final diagnoses:  Viral illness  Nausea vomiting and diarrhea  Acute cough  Other fatigue     Discharge Instructions      -Pending flu, COVID testing.  Will contact you with any positive results. - Sent cough medicine and nausea medicine - You need to isolate until you are fever free for 24 hours and symptoms are improving. - Increase rest and fluids. - You should be seen again if you have uncontrolled fever, weakness or worsening breathing problem.     ED Prescriptions     Medication Sig Dispense Auth. Provider   promethazine-dextromethorphan (PROMETHAZINE-DM) 6.25-15 MG/5ML syrup Take 5 mLs by mouth 4 (four) times daily as needed. 118 mL Eusebio Friendly B, PA-C   ondansetron (ZOFRAN-ODT) 4 MG disintegrating tablet Take 1 tablet (4 mg total) by mouth every 8 (eight) hours as needed. 15 tablet Gareth Morgan      PDMP not reviewed this encounter.   Shirlee Latch, PA-C 07/24/23 1952

## 2023-10-10 ENCOUNTER — Emergency Department (HOSPITAL_BASED_OUTPATIENT_CLINIC_OR_DEPARTMENT_OTHER): Payer: PRIVATE HEALTH INSURANCE

## 2023-10-10 ENCOUNTER — Other Ambulatory Visit: Payer: Self-pay

## 2023-10-10 ENCOUNTER — Encounter (HOSPITAL_BASED_OUTPATIENT_CLINIC_OR_DEPARTMENT_OTHER): Payer: Self-pay | Admitting: *Deleted

## 2023-10-10 ENCOUNTER — Emergency Department (HOSPITAL_BASED_OUTPATIENT_CLINIC_OR_DEPARTMENT_OTHER)
Admission: EM | Admit: 2023-10-10 | Discharge: 2023-10-10 | Disposition: A | Discharge status: Home / Self Care | Payer: PRIVATE HEALTH INSURANCE | Attending: Emergency Medicine | Admitting: Emergency Medicine

## 2023-10-10 DIAGNOSIS — S0083XA Contusion of other part of head, initial encounter: Secondary | ICD-10-CM

## 2023-10-10 DIAGNOSIS — R200 Anesthesia of skin: Secondary | ICD-10-CM | POA: Diagnosis not present

## 2023-10-10 DIAGNOSIS — S0992XA Unspecified injury of nose, initial encounter: Secondary | ICD-10-CM | POA: Diagnosis present

## 2023-10-10 DIAGNOSIS — S0031XA Abrasion of nose, initial encounter: Secondary | ICD-10-CM | POA: Insufficient documentation

## 2023-10-10 DIAGNOSIS — S00511A Abrasion of lip, initial encounter: Secondary | ICD-10-CM | POA: Diagnosis not present

## 2023-10-10 DIAGNOSIS — S0990XA Unspecified injury of head, initial encounter: Secondary | ICD-10-CM

## 2023-10-10 DIAGNOSIS — W01198A Fall on same level from slipping, tripping and stumbling with subsequent striking against other object, initial encounter: Secondary | ICD-10-CM | POA: Insufficient documentation

## 2023-10-10 DIAGNOSIS — Z7984 Long term (current) use of oral hypoglycemic drugs: Secondary | ICD-10-CM | POA: Diagnosis not present

## 2023-10-10 NOTE — ED Notes (Signed)
 Patient transported to CT

## 2023-10-10 NOTE — ED Notes (Signed)
 Reviewed D/C information with the patient, pt verbalized understanding. No additional concerns at this time.

## 2023-10-10 NOTE — ED Triage Notes (Signed)
 Pt fell on Saturday and struck her face (upper lip and nose) on the ground and sustained a carpet burn.  No LOC but pt has continued to have intermittent headaches since and reports some numbness in area that she struck her face.  No focal weakness.  Pt is alert and oriented.

## 2023-10-10 NOTE — Discharge Instructions (Signed)
Take ibuprofen 600 mg every 6 hours as needed for pain.  Follow-up with primary doctor if symptoms persist, and return to the ER if symptoms significantly worsen or change.

## 2023-10-10 NOTE — ED Provider Notes (Signed)
 [facility] AT [facility] Provider Note   CSN: [id number] Arrival date & time: 10/10/23  0455     History  Chief Complaint  Patient presents with   Facial Injury    [name] is a 38 y.o. female.  Patient is a 38 year old female with no significant past medical history.  Patient presenting today for evaluation of headache and facial numbness.  She reports falling 3 days ago and landing on her face in the living room.  She has had swelling and numbness to her nose and upper lip since.  She also has abrasions to her nose and upper lip.  She denies having any neck pain, but does admit to a headache.  She also reports headaches off and on for several months, but worse since she fell.       Home Medications Prior to Admission medications   Medication Sig Start Date End Date Taking? Authorizing Provider  cetirizine (ZYRTEC) 10 MG tablet Take by mouth.    [provider]  Cholecalciferol  1.25 MG (50000 UT) TABS Take 50,000 Units by mouth once a week. 09/08/21   Jasmine Mesi D, MD  Famotidine (PEPCID PO) Take by mouth.    [provider]  metFORMIN  (GLUCOPHAGE ) 500 MG tablet Take 1 tablet (500 mg total) by mouth 2 (two) times daily with a meal. 09/08/21   Alvia Awkward, Caren D, MD  ondansetron  (ZOFRAN -ODT) 4 MG disintegrating tablet Take 1 tablet (4 mg total) by mouth every 8 (eight) hours as needed. 07/24/23   Floydene Hy, PA-C  promethazine -dextromethorphan (PROMETHAZINE -DM) 6.25-15 MG/5ML syrup Take 5 mLs by mouth 4 (four) times daily as needed. 07/24/23   Floydene Hy, PA-C      Allergies    Benzodiazepines and Penicillins    Review of Systems   Review of Systems  All other systems reviewed and are negative.   Physical Exam Updated Vital Signs BP (!) 156/107   Pulse (!) 107   Temp 98 F (36.7 C) (Oral)   Resp 18   LMP 10/05/2023   SpO2 100%  Physical Exam Vitals and nursing note reviewed.  Constitutional:       General: She is not in acute distress.    Appearance: She is well-developed. She is not diaphoretic.  HENT:     Head: Normocephalic.     Comments: There are abrasions noted to the bridge of the nose and upper lip.  Dentition is intact. Eyes:     Extraocular Movements: Extraocular movements intact.     Pupils: Pupils are equal, round, and reactive to light.  Cardiovascular:     Rate and Rhythm: Normal rate and regular rhythm.     Heart sounds: No murmur heard.    No friction rub. No gallop.  Pulmonary:     Effort: Pulmonary effort is normal. No respiratory distress.     Breath sounds: Normal breath sounds. No wheezing.  Abdominal:     General: Bowel sounds are normal. There is no distension.     Palpations: Abdomen is soft.     Tenderness: There is no abdominal tenderness.  Musculoskeletal:        General: Normal range of motion.     Cervical back: Normal range of motion and neck supple.  Skin:    General: Skin is warm and dry.  Neurological:     General: No focal deficit present.     Mental Status: She is alert and oriented to person, place, and time.  Cranial Nerves: No cranial nerve deficit.     ED Results / Procedures / Treatments   Labs (all labs ordered are listed, but only abnormal results are displayed) Labs Reviewed - No data to display  EKG None  Radiology No results found.  Procedures Procedures    Medications Ordered in ED Medications - No data to display  ED Course/ Medical Decision Making/ A&P  Patient is a 38 year old female presenting with complaints of headache and facial numbness as described in the HPI.  Patient arrives with stable vital signs and is afebrile.  CT scans of the head and maxillofacial bones both unremarkable.  Patient to be discharged with as needed return.  Final Clinical Impression(s) / ED Diagnoses Final diagnoses:  None    Rx / DC Orders ED Discharge Orders     None         Orvilla Blander, MD 10/10/23 [phone]

## 2023-12-28 ENCOUNTER — Encounter: Payer: Self-pay | Admitting: Emergency Medicine

## 2023-12-28 ENCOUNTER — Emergency Department
Admission: EM | Admit: 2023-12-28 | Discharge: 2023-12-28 | Disposition: A | Discharge status: Home / Self Care | Payer: PRIVATE HEALTH INSURANCE | Attending: Emergency Medicine | Admitting: Emergency Medicine

## 2023-12-28 ENCOUNTER — Other Ambulatory Visit: Payer: Self-pay

## 2023-12-28 DIAGNOSIS — Z5321 Procedure and treatment not carried out due to patient leaving prior to being seen by health care provider: Secondary | ICD-10-CM | POA: Insufficient documentation

## 2023-12-28 DIAGNOSIS — I10 Essential (primary) hypertension: Secondary | ICD-10-CM | POA: Insufficient documentation

## 2023-12-28 DIAGNOSIS — R079 Chest pain, unspecified: Secondary | ICD-10-CM | POA: Diagnosis not present

## 2023-12-28 DIAGNOSIS — R42 Dizziness and giddiness: Secondary | ICD-10-CM | POA: Insufficient documentation

## 2023-12-28 DIAGNOSIS — R000 Tachycardia, unspecified: Secondary | ICD-10-CM | POA: Insufficient documentation

## 2023-12-28 DIAGNOSIS — M546 Pain in thoracic spine: Secondary | ICD-10-CM | POA: Insufficient documentation

## 2023-12-28 LAB — COMPREHENSIVE METABOLIC PANEL WITH GFR
ALT: 12 U/L (ref 0–44)
AST: 20 U/L (ref 15–41)
Albumin: 4.4 g/dL (ref 3.5–5.0)
Alkaline Phosphatase: 39 U/L (ref 38–126)
Anion gap: 13 (ref 5–15)
BUN: 13 mg/dL (ref 6–20)
CO2: 21 mmol/L — ABNORMAL LOW (ref 22–32)
Calcium: 9.4 mg/dL (ref 8.9–10.3)
Chloride: 103 mmol/L (ref 98–111)
Creatinine, Ser: 0.9 mg/dL (ref 0.44–1.00)
GFR, Estimated: >60 mL/min (ref >60)
Glucose, Bld: 147 mg/dL — ABNORMAL HIGH (ref 70–99)
Potassium: 3.7 mmol/L (ref 3.5–5.1)
Sodium: 137 mmol/L (ref 135–145)
Total Bilirubin: 0.9 mg/dL (ref 0.0–1.2)
Total Protein: 7.8 g/dL (ref 6.5–8.1)

## 2023-12-28 LAB — CBC
HCT: 42.9 % (ref 36.0–46.0)
Hemoglobin: 13.6 g/dL (ref 12.0–15.0)
MCH: 30.6 pg (ref 26.0–34.0)
MCHC: 31.7 g/dL (ref 30.0–36.0)
MCV: 96.4 fL (ref 80.0–100.0)
Platelets: 163 K/uL (ref 150–400)
RBC: 4.45 MIL/uL (ref 3.87–5.11)
RDW: 12 % (ref 11.5–15.5)
WBC: 8.5 K/uL (ref 4.0–10.5)
nRBC: 0 % (ref 0.0–0.2)

## 2023-12-28 LAB — URINALYSIS, ROUTINE W REFLEX MICROSCOPIC
Bilirubin Urine: NEGATIVE
Glucose, UA: NEGATIVE mg/dL
Hgb urine dipstick: NEGATIVE
Ketones, ur: 5 mg/dL — AB
Leukocytes,Ua: NEGATIVE
Nitrite: NEGATIVE
Protein, ur: 30 mg/dL — AB
Specific Gravity, Urine: 1.032 — ABNORMAL HIGH (ref 1.005–1.030)
pH: 5 (ref 5.0–8.0)

## 2023-12-28 LAB — TROPONIN I (HIGH SENSITIVITY): Troponin I (High Sensitivity): <2 ng/L (ref <18)

## 2023-12-28 LAB — CBG MONITORING, ED: Glucose-Capillary: 126 mg/dL — ABNORMAL HIGH (ref 70–99)

## 2023-12-28 LAB — PREGNANCY, URINE: Preg Test, Ur: NEGATIVE

## 2023-12-28 NOTE — ED Triage Notes (Signed)
 Patient c/o dizziness for a few days, patient reports episode of htn at work.  Patient reports taking dramamine, which helped with the dizziness, but it returned.  Patient also c/o back pain in the middle of her back that started tonight. Patient reports that while driving home from work tonight, she started having heaviness in her chest and heart rate jumped to 150.  Patient states that she just feels off now.
